# Patient Record
Sex: Female | Born: 2000 | Race: White | Hispanic: Yes | Marital: Single | Smoking: Never smoker
Health system: Southern US, Community
[De-identification: ages and names within clinical notes are randomized; demographics above are authoritative.]

## PROBLEM LIST (undated history)

## (undated) DIAGNOSIS — R519 Headache, unspecified: Secondary | ICD-10-CM

## (undated) DIAGNOSIS — H53149 Visual discomfort, unspecified: Secondary | ICD-10-CM

## (undated) DIAGNOSIS — J45909 Unspecified asthma, uncomplicated: Secondary | ICD-10-CM

## (undated) HISTORY — DX: Headache, unspecified: R51.9

---

## 2021-08-10 ENCOUNTER — Emergency Department (HOSPITAL_COMMUNITY)
Admission: EM | Admit: 2021-08-10 | Discharge: 2021-08-10 | Disposition: A | Discharge status: Home / Self Care | Attending: Emergency Medicine | Admitting: Emergency Medicine

## 2021-08-10 ENCOUNTER — Emergency Department (HOSPITAL_COMMUNITY)

## 2021-08-10 ENCOUNTER — Other Ambulatory Visit: Payer: Self-pay

## 2021-08-10 ENCOUNTER — Encounter (HOSPITAL_COMMUNITY): Payer: Self-pay

## 2021-08-10 DIAGNOSIS — R4701 Aphasia: Secondary | ICD-10-CM | POA: Diagnosis not present

## 2021-08-10 DIAGNOSIS — R2990 Unspecified symptoms and signs involving the nervous system: Secondary | ICD-10-CM | POA: Diagnosis not present

## 2021-08-10 DIAGNOSIS — R519 Headache, unspecified: Secondary | ICD-10-CM | POA: Diagnosis present

## 2021-08-10 DIAGNOSIS — G43909 Migraine, unspecified, not intractable, without status migrainosus: Secondary | ICD-10-CM | POA: Diagnosis not present

## 2021-08-10 HISTORY — DX: Unspecified asthma, uncomplicated: J45.909

## 2021-08-10 HISTORY — DX: Visual discomfort, unspecified: H53.149

## 2021-08-10 LAB — COMPREHENSIVE METABOLIC PANEL
ALT: 16 U/L (ref 0–44)
AST: 21 U/L (ref 15–41)
Albumin: 5.1 g/dL — ABNORMAL HIGH (ref 3.5–5.0)
Alkaline Phosphatase: 60 U/L (ref 38–126)
Anion gap: 10 (ref 5–15)
BUN: 13 mg/dL (ref 6–20)
CO2: 23 mmol/L (ref 22–32)
Calcium: 10.3 mg/dL (ref 8.9–10.3)
Chloride: 103 mmol/L (ref 98–111)
Creatinine, Ser: 0.58 mg/dL (ref 0.44–1.00)
GFR, Estimated: >60 mL/min (ref >60)
Glucose, Bld: 95 mg/dL (ref 70–99)
Potassium: 3.7 mmol/L (ref 3.5–5.1)
Sodium: 136 mmol/L (ref 135–145)
Total Bilirubin: 0.7 mg/dL (ref 0.3–1.2)
Total Protein: 9.2 g/dL — ABNORMAL HIGH (ref 6.5–8.1)

## 2021-08-10 LAB — CBC WITH DIFFERENTIAL/PLATELET
Abs Immature Granulocytes: 0.01 10*3/uL (ref 0.00–0.07)
Basophils Absolute: 0.1 10*3/uL (ref 0.0–0.1)
Basophils Relative: 1 %
Eosinophils Absolute: 0 10*3/uL (ref 0.0–0.5)
Eosinophils Relative: 0 %
HCT: 44.2 % (ref 36.0–46.0)
Hemoglobin: 15.2 g/dL — ABNORMAL HIGH (ref 12.0–15.0)
Immature Granulocytes: 0 %
Lymphocytes Relative: 23 %
Lymphs Abs: 1.9 10*3/uL (ref 0.7–4.0)
MCH: 29.9 pg (ref 26.0–34.0)
MCHC: 34.4 g/dL (ref 30.0–36.0)
MCV: 86.8 fL (ref 80.0–100.0)
Monocytes Absolute: 0.5 10*3/uL (ref 0.1–1.0)
Monocytes Relative: 6 %
Neutro Abs: 5.5 10*3/uL (ref 1.7–7.7)
Neutrophils Relative %: 70 %
Platelets: 302 10*3/uL (ref 150–400)
RBC: 5.09 MIL/uL (ref 3.87–5.11)
RDW: 12.6 % (ref 11.5–15.5)
WBC: 8 10*3/uL (ref 4.0–10.5)
nRBC: 0 % (ref 0.0–0.2)

## 2021-08-10 LAB — I-STAT BETA HCG BLOOD, ED (MC, WL, AP ONLY): I-stat hCG, quantitative: <5 m[IU]/mL (ref <5)

## 2021-08-10 LAB — I-STAT CHEM 8, ED
BUN: 11 mg/dL (ref 6–20)
Calcium, Ion: 1.23 mmol/L (ref 1.15–1.40)
Chloride: 103 mmol/L (ref 98–111)
Creatinine, Ser: 0.5 mg/dL (ref 0.44–1.00)
Glucose, Bld: 90 mg/dL (ref 70–99)
HCT: 45 % (ref 36.0–46.0)
Hemoglobin: 15.3 g/dL — ABNORMAL HIGH (ref 12.0–15.0)
Potassium: 3.9 mmol/L (ref 3.5–5.1)
Sodium: 139 mmol/L (ref 135–145)
TCO2: 24 mmol/L (ref 22–32)

## 2021-08-10 LAB — APTT: aPTT: 30 seconds (ref 24–36)

## 2021-08-10 LAB — PROTIME-INR
INR: 1 (ref 0.8–1.2)
Prothrombin Time: 13.6 seconds (ref 11.4–15.2)

## 2021-08-10 MED ORDER — KETOROLAC TROMETHAMINE 15 MG/ML IJ SOLN
15.0000 mg | Freq: Once | INTRAMUSCULAR | Status: AC
  Administered 2021-08-10: 15 mg via INTRAVENOUS
  Filled 2021-08-10: qty 1

## 2021-08-10 MED ORDER — METOCLOPRAMIDE HCL 5 MG/ML IJ SOLN
10.0000 mg | Freq: Once | INTRAMUSCULAR | Status: AC
  Administered 2021-08-10: 10 mg via INTRAVENOUS
  Filled 2021-08-10: qty 2

## 2021-08-10 MED ORDER — IOHEXOL 350 MG/ML SOLN
100.0000 mL | Freq: Once | INTRAVENOUS | Status: AC | PRN
  Administered 2021-08-10: 100 mL via INTRAVENOUS

## 2021-08-10 MED ORDER — DIPHENHYDRAMINE HCL 50 MG/ML IJ SOLN
25.0000 mg | Freq: Once | INTRAMUSCULAR | Status: AC
  Administered 2021-08-10: 25 mg via INTRAVENOUS
  Filled 2021-08-10: qty 1

## 2021-08-10 NOTE — Progress Notes (Signed)
Code stroke activated ?

## 2021-08-10 NOTE — ED Notes (Signed)
Pt's NIH repeated, aphasia and speech tested with stroke book. No aphasia noted, and pt was able to read all phrases, words, and describe picture. Pt still reports relief of headache symptoms, but remains sensitive to light ?

## 2021-08-10 NOTE — ED Notes (Signed)
Pt returned from CT - telepsych machine in room, experiencing technical problems, attempting to reboot with tech support now. ?

## 2021-08-10 NOTE — Progress Notes (Deleted)
[id number] Dr Rory Percy on caregility cart for neuro eval ?

## 2021-08-10 NOTE — ED Notes (Signed)
Patient transported to MRI 

## 2021-08-10 NOTE — ED Notes (Signed)
Pt reporting some relief of HA, is being wheeled to MRI at this time ?

## 2021-08-10 NOTE — Progress Notes (Signed)
Dr Wilford Corner on cart for neuro evaluation ?

## 2021-08-10 NOTE — Discharge Instructions (Signed)
Make an appointment to follow-up with the neurologist as discussed.  Return to the emergency room if you have any worsening symptoms. ?

## 2021-08-10 NOTE — ED Provider Notes (Signed)
?[facility] DEPT ?Provider Note ? ? ?CSN: [id number] ?Arrival date & time: 08/10/21  1720 ? ?  ? ?History ? ?Chief Complaint  ?Patient presents with  ? Headache  ? ? ?Mindy Woodward is a 21 y.o. female. ? ?Patient is a 21 year old female who presents with a headache and speech difficulty.  She states it started around 230 this afternoon.  She had a sudden onset of headache.  She also was having trouble getting her words out.  She was in a class at school during this time.  She denies any history of prior headaches like this.  No history of migraines.  She is holding some stuffed animals which she says are her support animals.  She denies any similar symptoms with anxiety or panic attacks. ? ? ?  ? ?Home Medications ?Prior to Admission medications   ?Not on File  ?   ? ?Allergies    ?Penicillins   ? ?Review of Systems   ?Review of Systems  ?Constitutional:  Negative for chills, diaphoresis, fatigue and fever.  ?HENT:  Negative for congestion, rhinorrhea and sneezing.   ?Eyes: Negative.   ?Respiratory:  Negative for cough, chest tightness and shortness of breath.   ?Cardiovascular:  Negative for chest pain and leg swelling.  ?Gastrointestinal:  Negative for abdominal pain, blood in stool, diarrhea, nausea and vomiting.  ?Genitourinary:  Negative for difficulty urinating, flank pain, frequency and hematuria.  ?Musculoskeletal:  Negative for arthralgias and back pain.  ?Skin:  Negative for rash.  ?Neurological:  Positive for speech difficulty and headaches. Negative for dizziness, weakness and numbness.  ? ?Physical Exam ?Updated Vital Signs ?BP 107/69   Pulse 97   Temp 98.4 ?F (36.9 ?C) (Oral)   Resp (!) 22   Ht 5\' 2"  (1.575 m)   Wt 59 kg   SpO2 98%   BMI 23.78 kg/m?  ?Physical Exam ?Constitutional:   ?   Appearance: She is well-developed.  ?HENT:  ?   Head: Normocephalic and atraumatic.  ?Eyes:  ?   Pupils: Pupils are equal, round, and reactive to light.  ?Cardiovascular:  ?   Rate  and Rhythm: Normal rate and regular rhythm.  ?   Heart sounds: Normal heart sounds.  ?Pulmonary:  ?   Effort: Pulmonary effort is normal. No respiratory distress.  ?   Breath sounds: Normal breath sounds. No wheezing or rales.  ?Chest:  ?   Chest wall: No tenderness.  ?Abdominal:  ?   General: Bowel sounds are normal.  ?   Palpations: Abdomen is soft.  ?   Tenderness: There is no abdominal tenderness. There is no guarding or rebound.  ?Musculoskeletal:     ?   General: Normal range of motion.  ?   Cervical back: Normal range of motion and neck supple.  ?Lymphadenopathy:  ?   Cervical: No cervical adenopathy.  ?Skin: ?   General: Skin is warm and dry.  ?   Findings: No rash.  ?Neurological:  ?   Mental Status: She is alert and oriented to person, place, and time.  ?   Comments: Patient has some hesitant speech and aphasia.  No facial drooping.  Motor 5 out of 5 all extremities, sensation grossly intact to light touch all extremities.  FTN intact  ? ? ?ED Results / Procedures / Treatments   ?Labs ?(all labs ordered are listed, but only abnormal results are displayed) ?Labs Reviewed  ?CBC WITH DIFFERENTIAL/PLATELET - Abnormal; Notable for the following  components:  ?    Result Value  ? Hemoglobin 15.2 (*)   ? All other components within normal limits  ?COMPREHENSIVE METABOLIC PANEL - Abnormal; Notable for the following components:  ? Total Protein 9.2 (*)   ? Albumin 5.1 (*)   ? All other components within normal limits  ?I-STAT CHEM 8, ED - Abnormal; Notable for the following components:  ? Hemoglobin 15.3 (*)   ? All other components within normal limits  ?PROTIME-INR  ?APTT  ?URINALYSIS, ROUTINE W REFLEX MICROSCOPIC  ?ETHANOL  ?RAPID URINE DRUG SCREEN, HOSP PERFORMED  ?I-STAT BETA HCG BLOOD, ED ([facility], [facility], [facility] ONLY)  ? ? ?EKG ?None ? ?Radiology ?MR BRAIN WO CONTRAST ? ?Result Date: 08/10/2021 ?CLINICAL DATA:  Acute neurologic deficit. Slurred speech and headache. EXAM: MRI HEAD WITHOUT CONTRAST TECHNIQUE: Multiplanar,  multiecho pulse sequences of the brain and surrounding structures were obtained without intravenous contrast. COMPARISON:  None. FINDINGS: Brain: No acute infarct, mass effect or extra-axial collection. No acute or chronic hemorrhage. Normal white matter signal, parenchymal volume and CSF spaces. The midline structures are normal. Vascular: Major flow voids are preserved. Skull and upper cervical spine: Normal calvarium and skull base. Visualized upper cervical spine and soft tissues are normal. Sinuses/Orbits:No paranasal sinus fluid levels or advanced mucosal thickening. No mastoid or middle ear effusion. Normal orbits. IMPRESSION: Normal brain MRI. Electronically Signed   By: Ulyses Jarred M.D.   On: 08/10/2021 21:20  ? ?CT HEAD CODE STROKE WO CONTRAST ? ?Result Date: 08/10/2021 ?CLINICAL DATA:  Headache and dizziness since 2 p.m. today. EXAM: CT ANGIOGRAPHY HEAD AND NECK CT Perfusion Head TECHNIQUE: Multidetector CT imaging of the head and neck was performed using the standard protocol during bolus administration of intravenous contrast. Multiplanar CT image reconstructions and MIPs were obtained to evaluate the vascular anatomy. Carotid stenosis measurements (when applicable) are obtained utilizing NASCET criteria, using the distal internal carotid diameter as the denominator. Multiphase CT imaging of the brain was performed following IV bolus contrast injection. Subsequent parametric perfusion maps were calculated using RAPID software. RADIATION DOSE REDUCTION: This exam was performed according to the departmental dose-optimization program which includes automated exposure control, adjustment of the mA and/or kV according to patient size and/or use of iterative reconstruction technique. CONTRAST:  100 cc Omnipaque 350 COMPARISON:  None. FINDINGS: Brain: There is no evidence of acute intracranial hemorrhage, extra-axial fluid collection, or acute infarct. Parenchymal volume is normal. Gray-white differentiation  is preserved. There is no mass lesion. There is no mass effect or midline shift. Vascular: No hyperdense vessel or unexpected calcification. Skull: Normal. Negative for fracture or focal lesion. Sinuses/Orbits: The imaged paranasal sinuses are clear. The globes and orbits are unremarkable. Other: None. ASPECTS [facility] Stroke Program Early CT Score) - Ganglionic level infarction (caudate, lentiform nuclei, internal capsule, insula, M1-M3 cortex): 7 - Supraganglionic infarction (M4-M6 cortex): 3 Total score (0-10 with 10 being normal): 10 CTA NECK FINDINGS Aortic arch: The aortic arch is normal. The origins of the major branch vessels are patent. The subclavian arteries are patent to the level imaged. Right carotid system: The right common, internal, and external carotid arteries are patent, without hemodynamically significant stenosis or occlusion. There is no dissection or aneurysm. Left carotid system: The left common, internal, and external carotid arteries are patent, without hemodynamically significant stenosis or occlusion. There is no dissection or aneurysm. Vertebral arteries: The vertebral arteries are patent, without hemodynamically significant stenosis or occlusion. There is no dissection or aneurysm. Skeleton: The bones are  normal. There is no acute osseous abnormality or aggressive osseous lesion. There is no visible canal hematoma. Other neck: The soft tissues are unremarkable. Upper chest: Imaged lung apices are clear. Other: There is a 5 mm x 8 mm soft tissue nodule in the subcutaneous fat of the left upper back at the C7-T1 level (7-114). Review of the MIP images confirms the above findings CTA HEAD FINDINGS Anterior circulation: The intracranial ICAs are patent. The bilateral MCAs are patent. The bilateral ACAs are patent. The anterior communicating artery is normal. There is no aneurysm or AVM. Posterior circulation: The bilateral V4 segments are patent. PICA is identified bilaterally. The basilar  artery is patent. The bilateral PCAs are patent. Small bilateral posterior communicating arteries are identified. There is no aneurysm or AVM. Venous sinuses: Patent. Anatomic variants: None. Review of the MI

## 2021-08-10 NOTE — Consult Note (Signed)
[facility] Telemedicine Consult ? ? ?Requesting Provider:  Dr Fredderick Phenix ?Consult Participants: Dr. Marthe Patch, Telespecialist RN Deanna   Bedside RN--Anna ?Location of the provider: ?Location of the patient: [facility] 10 ? ?This consult was provided via telemedicine with 2-way video and audio communication. The patient/family was informed that care would be provided in this way and agreed to receive care in this manner.  ? ? ?Chief Complaint: Speech problem, headache ? ?HPI: Patient is a 21 year old woman with no significant past medical history other than asthma and a chronic history of photophobia, who was brought in for evaluation of multiple symptoms with last known well of around 2 PM. ?She told me that she was in her Spanish class watching a Spanish movie and started getting a headache and feeling unwell.  She was having some trouble with her speech.  I could not get a full sense of how she was brought in but she got to the hospital and that it was noticed that she had some expressive aphasia.  She has also been complaining of some cough which is new as well as some new headache.  She denies history of migraines but says she has had photophobia without headaches for many years.  Currently she has a headache that had started around 2 PM and has not gone away and actually has gotten worse. ?Her speech is somewhat stuttered.  She also said that she was having trouble grasping her stuffed animals which are her support animals. ?She was taken for stat head CT which was normal.  CT angio head and neck and CT perfusion study was done given that she was outside the window for IV tPA.  All of them were unremarkable-see detailed reads in the chart. ? ?After the imaging was completed and the patient was back in the room, I specifically asked her about stressors-she reports some strained relationship with parents and some ongoing trouble and was somewhat evasive and not willing to discuss those issues as her roommate  was also in the hospital room. ? ? ?Past Medical History:  ?Diagnosis Date  ? Asthma   ? Photophobia   ? ? ?No current facility-administered medications for this encounter. ?No current outpatient medications on file. ? ? ? ?LKW: 2 PM ?tpa given?: No, stroke less likely on exam and patient outside the window by the time stroke paged out ?IR Thrombectomy? No, not an LVO ?Modified Rankin Scale: 0-Completely asymptomatic and back to baseline post- stroke ?Time of teleneurologist evaluation: 6:47 PM ? ?Exam: ?Vitals:  ? 08/10/21 1804  ?BP: 115/75  ?Pulse: 94  ?Resp: 16  ?Temp: 98.4 ?F (36.9 ?C)  ?SpO2: 97%  ? ? ?General: Awake alert in no distress ?HEENT: Normocephalic/atraumatic ?CVS: Regular rate rhythm on the monitor ?Respiratory: Breathing well saturating normally on room air ?Neurological exam ?She is awake alert oriented x3.  Her speech has somewhat of a hesitancy and stuttering nature.  No problems with naming, repetition or comprehension.  At times she did start to talk in a baby voice.  Face symmetric, pupils equal round reactive, extraocular movements intact, visual fields full, facial sensation intact.  No drift in any fours.  Sensation intact.  Coordination intact. ? ? ?NIHSS ?1A: Level of Consciousness - 0 ?1B: Ask Month and Age - 0 ?1C: 'Blink Eyes' & 'Squeeze Hands' - 0 ?2: Test Horizontal Extraocular Movements - 0 ?3: Test Visual Fields - 0 ?4: Test Facial Palsy - 0 ?5A: Test Left Arm Motor Drift - 0 ?5B:  Test Right Arm Motor Drift - 0 ?6A: Test Left Leg Motor Drift - 0 ?6B: Test Right Leg Motor Drift - 0 ?7: Test Limb Ataxia - 0 ?8: Test Sensation - 0 ?9: Test Language/Aphasia- 0 ?10: Test Dysarthria - 1 ?11: Test Extinction/Inattention - 0 ?NIHSS score: 1 ? ? ?Imaging Reviewed:  ?CT head aspects 10.  No bleed ?CT angiography head and neck-no emergent LVO ?CT perfusion study was marred by motion-she had a big coughing fit while the CT perfusion study was being done and there are some artifactual numbers  there-unreliable study. ? ?Labs reviewed in epic and pertinent values follow: ?CBC ?   ?Component Value Date/Time  ? WBC 8.0 08/10/2021 1829  ? RBC 5.09 08/10/2021 1829  ? HGB 15.3 (H) 08/10/2021 1846  ? HCT 45.0 08/10/2021 1846  ? PLT 302 08/10/2021 1829  ? MCV 86.8 08/10/2021 1829  ? MCH 29.9 08/10/2021 1829  ? MCHC 34.4 08/10/2021 1829  ? RDW 12.6 08/10/2021 1829  ? LYMPHSABS 1.9 08/10/2021 1829  ? MONOABS 0.5 08/10/2021 1829  ? EOSABS 0.0 08/10/2021 1829  ? BASOSABS 0.1 08/10/2021 1829  ? ?CMP  ?   ?Component Value Date/Time  ? NA 139 08/10/2021 1846  ? K 3.9 08/10/2021 1846  ? CL 103 08/10/2021 1846  ? CO2 23 08/10/2021 1829  ? GLUCOSE 90 08/10/2021 1846  ? BUN 11 08/10/2021 1846  ? CREATININE 0.50 08/10/2021 1846  ? CALCIUM 10.3 08/10/2021 1829  ? PROT 9.2 (H) 08/10/2021 1829  ? ALBUMIN 5.1 (H) 08/10/2021 1829  ? AST 21 08/10/2021 1829  ? ALT 16 08/10/2021 1829  ? ALKPHOS 60 08/10/2021 1829  ? BILITOT 0.7 08/10/2021 1829  ? GFRNONAA >60 08/10/2021 1829  ? ? ? ?Assessment:  ?21 year old with past medical history of asthma and photophobia presenting for evaluation of sudden onset of headache followed by speech disturbance-initially noted to have some expressive aphasia but on my examination, on exam was more consistent with just some stuttering speech along with some nonorganic findings as documented in the exam. ?I suspect at this time her symptoms might be related to a complex migraine or conversion disorder given some familial discord with parents. ?That said, a missed stroke in a young individual would be catastrophic and I would recommend stat MRI imaging. ?CT head, CT angio head and neck and CT perfusion study are unremarkable for any evidence of a large acute ischemic stroke. ? ?Impression: ?- Evaluate for stroke-less likely ? 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 ?Recommendations:  ?MRI brain w/o contrast - STAT ?Migraine cocktail ?If MRI is negative and the headache improves with a migraine cocktail,  can be discharged home with outpatient neurology follow-up ?If her MRI is negative but the symptoms do not improve, I would further investigate with an EEG.  Unfortunately, nights and weekends EEG is not available at [facility] so if she needs to be admitted for observation, it should be done at [facility] under the hospital service. ?May need outpatient psychologist/psychiatrist referral as well on discharge. ? ? ? ?Discussed with EDP--Dr. Fredderick Phenix, patient. ? ?This patient is receiving care for possible acute neurological changes. There was 55 minutes of care by this provider at the time of service, including time for direct evaluation via telemedicine, review of medical records, imaging studies and discussion of findings with providers, the patient and/or family. ? ?-- ?Milon Dikes, MD ?[facility] ?Pager: [phone] ?If 7pm to 7am, please call on call as listed on AMION. ?

## 2021-08-10 NOTE — ED Notes (Signed)
Patient transported to CT 

## 2021-08-10 NOTE — ED Notes (Signed)
Patient verbalizes understanding of discharge instructions. Opportunity for questioning and answers were provided. Armband removed by staff, pt discharged from ED. Ambulated out to lobby, riding home with friend ? ?

## 2021-08-10 NOTE — ED Triage Notes (Addendum)
Patient c/o headache and dizziness since 1400 today. Patient denies any N/V or blurred vision. ? ? ?Patient having speech difficulties and clutching stuffed animals. Patient states the pigs are her support. ?

## 2021-08-27 NOTE — Progress Notes (Signed)
? ?Referring:  ?Malvin Johns, MD ?[address] ?[place],  [place] [id number] ? ?PCP: ?Cyril Loosen B, PA-C ? ?Neurology was asked to evaluate Mindy Woodward, a 21 year old nonbinary patient for a chief complaint of headaches.  Our recommendations of care will be communicated by shared medical record.   ? ?CC:  headaches ? ?History provided from self ? ?HPI:  ?Medical co-morbidities: asthma ? ?The patient presents for evaluation of an episode of headache and slurred speech which occurred August 10, 2021. They were in Spanish class when they developed a bad headache. No clear triggers, although they note that they had skipped lunch that day. Their teacher noticed that their speech was slurred and cognition was slowed, so they were sent to the ED. At the ED, they struggled to answer basic questions including their race. MRI brain and CTA head/neck were unremarkable. Symptoms improved after they were given a headache cocktail. Headache lasted for a total of 4-5 hours. It was described as holocephalic aching with associated nausea. The patient has photophobia and phonophobia at baseline. ? ?They have a history of mild headaches which improve with caffeine, otherwise had not had severe headaches. ? ?Headache History: ?Onset: August 10, 2021 ?Triggers: none ?Aura: no ?Location: holocephalic ?Quality/Description: aching ?Associated Symptoms: ? Photophobia: sensitive to light at baseline ? Phonophobia: sensitive to noise at baseline ? Nausea: yes ?Worse with activity?: yes ?Duration of headaches: 4-5 hours ? ? ?Headache days per month: 1 ?Headache free days per month: 29 ? ?Current Treatment: ?Abortive ?none ? ?Preventative ?none ? ?Prior Therapies                                 ?none ? ? ?LABS: ?CBC ?   ?Component Value Date/Time  ? WBC 8.0 08/10/2021 1829  ? RBC 5.09 08/10/2021 1829  ? HGB 15.3 (H) 08/10/2021 1846  ? HCT 45.0 08/10/2021 1846  ? PLT 302 08/10/2021 1829  ? MCV 86.8 08/10/2021 1829  ? MCH 29.9 08/10/2021 1829   ? MCHC 34.4 08/10/2021 1829  ? RDW 12.6 08/10/2021 1829  ? LYMPHSABS 1.9 08/10/2021 1829  ? MONOABS 0.5 08/10/2021 1829  ? EOSABS 0.0 08/10/2021 1829  ? BASOSABS 0.1 08/10/2021 1829  ? ? ?  Latest Ref Rng & Units 08/10/2021  ?  6:46 PM 08/10/2021  ?  6:29 PM  ?CMP  ?Glucose 70 - 99 mg/dL 90   95    ?BUN 6 - 20 mg/dL 11   13    ?Creatinine 0.44 - 1.00 mg/dL 0.50   0.58    ?Sodium 135 - 145 mmol/L 139   136    ?Potassium 3.5 - 5.1 mmol/L 3.9   3.7    ?Chloride 98 - 111 mmol/L 103   103    ?CO2 22 - 32 mmol/L  23    ?Calcium 8.9 - 10.3 mg/dL  10.3    ?Total Protein 6.5 - 8.1 g/dL  9.2    ?Total Bilirubin 0.3 - 1.2 mg/dL  0.7    ?Alkaline Phos 38 - 126 U/L  60    ?AST 15 - 41 U/L  21    ?ALT 0 - 44 U/L  16    ? ? ? ?IMAGING:  ?MRI brain 08/10/21: unremarkable ?CTA head/neck 08/10/21: no large vessel occlusion or flow limiting stenosis ? ?Imaging independently reviewed on August 28, 2021  ? ?Current Outpatient Medications on File Prior to Visit  ?  Medication Sig Dispense Refill  ? albuterol (VENTOLIN HFA) 108 (90 Base) MCG/ACT inhaler Inhale 2 puffs into the lungs every 4 (four) hours as needed.    ? ?No current facility-administered medications on file prior to visit.  ? ? ? ?Allergies: ?Allergies  ?Allergen Reactions  ? Penicillins   ? ? ?Family History: ?Migraine or other headaches in the family:  none ?Aneurysms in a first degree relative:  none ?Brain tumors in the family:  none ?Other neurological illness in the family:   none ? ?Past Medical History: ?Past Medical History:  ?Diagnosis Date  ? Asthma   ? Photophobia   ? ? ?Past Surgical History ?History reviewed. No pertinent surgical history. ? ?Social History: ?Social History  ? ?Tobacco Use  ? Smoking status: Never  ? Smokeless tobacco: Never  ?Vaping Use  ? Vaping Use: Never used  ?Substance Use Topics  ? Alcohol use: Never  ? Drug use: Never  ? ? ?ROS: ?Negative for fevers, chills. Positive for headaches. All other systems reviewed and negative unless stated  otherwise in HPI. ? ? ?Physical Exam:  ? ?Vital Signs: ?BP 107/71   Pulse 87   Ht 5\' 2"  (1.575 m)   Wt 138 lb (62.6 kg)   BMI 25.24 kg/m?  ?GENERAL: well appearing,in no acute distress,alert ?SKIN:  Color, texture, turgor normal. No rashes or lesions ?HEAD:  Normocephalic/atraumatic. ?CV:  RRR ?RESP: Normal respiratory effort ?MSK: +tenderness to palpation over bilateral temples ? ?NEUROLOGICAL: ?Mental Status: Alert, oriented to person, place and time,Follows commands ?Cranial Nerves: PERRL, visual fields intact to confrontation, extraocular movements intact, facial sensation intact, no facial droop or ptosis, hearing grossly intact, no dysarthria ?Motor: muscle strength 5/5 both upper and lower extremities ?Reflexes: 2+ throughout ?Sensation: intact to light touch all 4 extremities ?Coordination: Finger-to- nose-finger intact bilaterally ?Gait: normal-based ? ? ?IMPRESSION: ?21 year old nonbinary patient with a history of asthma who presents for evaluation of headaches and slurred speech. MRI brain and CTA head and neck were normal. Their episode in the ED likely represents a migraine headache. They have not had any other headaches since their ED visit. Will start Maxalt for migraine rescue. Counseled on maintaining a regular eating schedule to help avoid future headaches. ? ?PLAN: ?-Rescue: Start Maxalt 10 mg PRN ?-Counseled to maintain regular eating schedule (skipped lunch when last migraine occurred) ? ?I spent a total of 30 minutes chart reviewing and counseling the patient. Headache education was done. Discussed treatment options including acute medications. Discussed medication side effects, adverse reactions and drug interactions. Written educational materials and patient instructions outlining all of the above were given. ? ?Follow-up: 6 months ? ? ?Genia Harold, MD ?08/28/2021   ?1:27 PM ? ? ?

## 2021-08-28 ENCOUNTER — Encounter: Payer: Self-pay | Admitting: Psychiatry

## 2021-08-28 ENCOUNTER — Ambulatory Visit: Admitting: Psychiatry

## 2021-08-28 VITALS — BP 107/71 | HR 87 | Ht 62.0 in | Wt 138.0 lb

## 2021-08-28 DIAGNOSIS — G43009 Migraine without aura, not intractable, without status migrainosus: Secondary | ICD-10-CM

## 2021-08-28 MED ORDER — RIZATRIPTAN BENZOATE 10 MG PO TABS: 10.0000 mg | ORAL_TABLET | ORAL | 3 refills | Status: AC | PRN

## 2021-08-28 NOTE — Patient Instructions (Addendum)
Start rizatriptan as needed for migraine. Take at the onset of migraine. If headache recurs or does not fully resolve, you may take a second dose after 2 hours. Please avoid taking more than 2 days per week  to avoid rebound headaches ? ? ? ?HEADACHE DIET: Foods and beverages which may trigger migraine ?Note that only 20% of headache patients are food sensitive. You will know if you are food sensitive if you get a headache consistently 20 minutes to 2 hours after eating a certain food. Only cut out a food if it causes headaches, otherwise you might remove foods you enjoy! What matters most for diet is to eat a well balanced healthy diet full of vegetables and low fat protein, and to not miss meals. ? ?Chocolate, other sweets ?ALL cheeses except cottage and cream cheese ?Dairy products, yogurt, sour cream, ice cream ?Liver ?Meat extracts (Bovril, Marmite, meat tenderizers) ?Meats or fish which have undergone aging, fermenting, pickling or smoking. These include: Hotdogs,salami,Lox,sausage, ?mortadellas,smoked salmon, pepperoni, Pickled herring ?Pods of broad bean (English beans, Chinese pea pods, [place] (fava) beans, lima and navy beans ?Ripe avocado, ripe banana ?Yeast extracts or active yeast preparations such as Brewer's or Fleishman's (commercial bakes goods are permitted) ?Tomato based foods, pizza (lasagna, etc.) ? ?MSG (monosodium glutamate) is disguised as many things; look for these common aliases: ?Monopotassium glutamate ?Autolysed yeast ?Hydrolysed protein ?Sodium caseinate ??flavorings? ??all natural preservatives" ?Nutrasweet ? ?Avoid all other foods that convincingly provoke headaches. ? ?Resources: The Dizzy [place] Laundry Your Headache Diet, migrainestrong.com  ?https://zamora-andrews.com/ ? ?Caffeine and Migraine ?For patients that have migraine, caffeine intake more than 3 days per week can lead to dependency and increased migraine frequency. I would  recommend cutting back on your caffeine intake as best you can. The recommended amount of caffeine is 200-300 mg daily, although migraine patients may experience dependency at even lower doses. While you may notice an increase in headache temporarily, cutting back will be helpful for headaches in the long run. For more information on caffeine and migraine, visit: https://americanmigrainefoundation.org/resource-library/caffeine-and-migraine/ ? ?Headache Prevention Strategies: ? ?1. Maintain a headache diary; learn to identify and avoid triggers.  ?- This can be a simple note where you log when you had a headache, associated symptoms, and medications used ?- There are several smartphone apps developed to help track migraines: Migraine Buddy, Migraine Monitor, Curelator N1-Headache App ? ?Common triggers include: ?Emotional triggers: ?Emotional/Upset family or friends ?Emotional/Upset occupation ?Business reversal/success ?Anticipation anxiety ?Crisis-serious ?Post-crisis periodNew job/position ?  ?Physical triggers: ?Vacation Day ?Weekend ?Strenuous Exercise ?High Altitude Location ?New Move ?Menstrual Day ?Physical Illness ?Oversleep/Not enough sleep ?Weather changes ?Light: Photophobia or light sesnitivity treatment involves a balance between desensitization and reduction in overly strong input. Use dark polarized glasses outside, but not inside. Avoid bright or fluorescent light, but do not dim environment to the point that going into a normally lit room hurts. Consider FL-41 tint lenses, which reduce the most irritating wavelengths without blocking too much light.  These can be obtained at axonoptics.com or theraspecs.com ?Foods: see list above. ? ?2. Limit use of acute treatments (over-the-counter medications, triptans, etc.) to no more than 2 days per week or 10 days per month to prevent medication overuse headache (rebound headache).   ? ?3. Follow a regular schedule (including weekends and holidays): ?Don't  skip meals. Eat a balanced diet. ?8 hours of sleep nightly. ?Minimize stress. ?Exercise 30 minutes per day. Being overweight is associated with a 5 times increased risk of chronic migraine. ?Keep  well hydrated and drink 6-8 glasses of water per day. ? ?4. Initiate non-pharmacologic measures at the earliest onset of your headache. ?Rest and quiet environment. ?Relax and reduce stress. Breathe2Relax is a free app that can instruct you on    some simple relaxtion and breathing techniques. Http://Dawnbuse.com is a    free website that provides teaching videos on relaxation.  Also, there are  many apps that   can be downloaded for ?mindful? relaxation.  An app called YOGA NIDRA will help walk you through mindfulness. Another app called Calm can be downloaded to give you a structured mindfulness guide with daily reminders and skill development. Headspace for guided meditation ?Mindfulness Based Stress Reduction Online Course: www.palousemindfulness.com ?Cold compresses. ? ?5. Don't wait!! Take the maximum allowable dosage of prescribed medication at the first sign of migraine. ? ?6. Compliance:  Take prescribed medication regularly as directed and at the first sign of a migraine. ? ?7. Communicate:  Call your physician when problems arise, especially if your headaches change, increase in frequency/severity, or become associated with neurological symptoms (weakness, numbness, slurred speech, etc.). ? ?8. Headache/pain management therapies: Consider various complementary methods, including medication, behavioral therapy, psychological counselling, biofeedback, massage therapy, acupuncture, dry needling, and other modalities.  Such measures may reduce the need for medications. Counseling for pain management, where patients learn to function and ignore/minimize their pain, seems to work very well. ? ? ?

## 2021-09-17 ENCOUNTER — Emergency Department (HOSPITAL_COMMUNITY)

## 2021-09-17 ENCOUNTER — Encounter (HOSPITAL_COMMUNITY): Payer: Self-pay | Admitting: Emergency Medicine

## 2021-09-17 ENCOUNTER — Emergency Department (HOSPITAL_COMMUNITY)
Admission: EM | Admit: 2021-09-17 | Discharge: 2021-09-17 | Disposition: A | Discharge status: Home / Self Care | Attending: Emergency Medicine | Admitting: Emergency Medicine

## 2021-09-17 DIAGNOSIS — R000 Tachycardia, unspecified: Secondary | ICD-10-CM | POA: Insufficient documentation

## 2021-09-17 DIAGNOSIS — N9489 Other specified conditions associated with female genital organs and menstrual cycle: Secondary | ICD-10-CM | POA: Insufficient documentation

## 2021-09-17 DIAGNOSIS — R002 Palpitations: Secondary | ICD-10-CM | POA: Diagnosis not present

## 2021-09-17 LAB — CBC
HCT: 44.6 % (ref 36.0–46.0)
Hemoglobin: 15.4 g/dL — ABNORMAL HIGH (ref 12.0–15.0)
MCH: 30.1 pg (ref 26.0–34.0)
MCHC: 34.5 g/dL (ref 30.0–36.0)
MCV: 87.1 fL (ref 80.0–100.0)
Platelets: 339 10*3/uL (ref 150–400)
RBC: 5.12 MIL/uL — ABNORMAL HIGH (ref 3.87–5.11)
RDW: 12.3 % (ref 11.5–15.5)
WBC: 8 10*3/uL (ref 4.0–10.5)
nRBC: 0 % (ref 0.0–0.2)

## 2021-09-17 LAB — URINALYSIS, ROUTINE W REFLEX MICROSCOPIC
Bilirubin Urine: NEGATIVE
Glucose, UA: NEGATIVE mg/dL
Hgb urine dipstick: NEGATIVE
Ketones, ur: NEGATIVE mg/dL
Leukocytes,Ua: NEGATIVE
Nitrite: NEGATIVE
Protein, ur: NEGATIVE mg/dL
Specific Gravity, Urine: 1.01 (ref 1.005–1.030)
pH: 7 (ref 5.0–8.0)

## 2021-09-17 LAB — BASIC METABOLIC PANEL
Anion gap: 9 (ref 5–15)
BUN: 14 mg/dL (ref 6–20)
CO2: 26 mmol/L (ref 22–32)
Calcium: 10.2 mg/dL (ref 8.9–10.3)
Chloride: 105 mmol/L (ref 98–111)
Creatinine, Ser: 0.6 mg/dL (ref 0.44–1.00)
GFR, Estimated: >60 mL/min (ref >60)
Glucose, Bld: 104 mg/dL — ABNORMAL HIGH (ref 70–99)
Potassium: 3.9 mmol/L (ref 3.5–5.1)
Sodium: 140 mmol/L (ref 135–145)

## 2021-09-17 LAB — I-STAT BETA HCG BLOOD, ED (MC, WL, AP ONLY): I-stat hCG, quantitative: <5 m[IU]/mL (ref <5)

## 2021-09-17 LAB — D-DIMER, QUANTITATIVE: D-Dimer, Quant: <0.27 ug/mL-FEU (ref 0.00–0.50)

## 2021-09-17 LAB — TSH: TSH: 2.053 u[IU]/mL (ref 0.350–4.500)

## 2021-09-17 NOTE — ED Triage Notes (Signed)
Per EMS, patient from [facility], c/o tachycardia, worsening with standing and ambulating. States feels "tight band around chest" that worsens with deep breathing.  ?

## 2021-09-17 NOTE — Discharge Instructions (Signed)
Overall recommend that you focus on sleep, stress reduction, decrease caffeine intake.  Your work-up today overall shows that your lungs and heart are in great health. ?

## 2021-09-17 NOTE — ED Provider Notes (Signed)
?[facility]-EMERGENCY DEPT ?Provider Note ? ? ?CSN: [id number] ?Arrival date & time: 09/17/21  1640 ? ?  ? ?History ? ?Chief Complaint  ?Patient presents with  ? Palpitations  ? ? ?Mindy Woodward is a 21 y.o. adult. ? ?The history is provided by the patient.  ?Palpitations ?Palpitations quality:  Fast ?Onset quality:  Sudden ?Timing:  Intermittent ?Progression:  Waxing and waning ?Chronicity:  New ?Context: anxiety and caffeine   ?Relieved by:  Nothing ?Worsened by:  Nothing ?Associated symptoms: no back pain, no chest pain, no chest pressure, no cough, no diaphoresis, no dizziness, no hemoptysis, no leg pain, no lower extremity edema, no malaise/fatigue, no nausea, no near-syncope, no numbness, no orthopnea, no PND, no shortness of breath, no syncope, no vomiting and no weakness   ?Risk factors: no hx of PE   ? ?  ? ?Home Medications ?Prior to Admission medications   ?Medication Sig Start Date End Date Taking? Authorizing Provider  ?albuterol (VENTOLIN HFA) 108 (90 Base) MCG/ACT inhaler Inhale 2 puffs into the lungs every 4 (four) hours as needed. 07/12/21   [provider]  ?rizatriptan (MAXALT) 10 MG tablet Take 1 tablet (10 mg total) by mouth as needed for migraine. May repeat in 2 hours if needed 08/28/21   Ocie Doyne, MD  ?   ? ?Allergies    ?Penicillins   ? ?Review of Systems   ?Review of Systems  ?Constitutional:  Negative for diaphoresis and malaise/fatigue.  ?Respiratory:  Negative for cough, hemoptysis and shortness of breath.   ?Cardiovascular:  Positive for palpitations. Negative for chest pain, orthopnea, syncope, PND and near-syncope.  ?Gastrointestinal:  Negative for nausea and vomiting.  ?Musculoskeletal:  Negative for back pain.  ?Neurological:  Negative for dizziness, weakness and numbness.  ? ?Physical Exam ?Updated Vital Signs ?BP 109/79   Pulse 100   Temp 98.8 ?F (37.1 ?C) (Oral)   Resp 16   SpO2 97%  ?Physical Exam ?Vitals and nursing note reviewed.   ?Constitutional:   ?   General: Mindy Woodward is not in acute distress. ?   Appearance: Mindy Woodward is well-developed. Mindy Woodward is not ill-appearing.  ?HENT:  ?   Head: Normocephalic and atraumatic.  ?   Nose: Nose normal.  ?   Mouth/Throat:  ?   Mouth: Mucous membranes are moist.  ?Eyes:  ?   Extraocular Movements: Extraocular movements intact.  ?   Conjunctiva/sclera: Conjunctivae normal.  ?   Pupils: Pupils are equal, round, and reactive to light.  ?Cardiovascular:  ?   Rate and Rhythm: Normal rate and regular rhythm.  ?   Heart sounds: No murmur heard. ?Pulmonary:  ?   Effort: Pulmonary effort is normal. No respiratory distress.  ?   Breath sounds: Normal breath sounds.  ?Abdominal:  ?   Palpations: Abdomen is soft.  ?   Tenderness: There is no abdominal tenderness.  ?Musculoskeletal:     ?   General: No swelling.  ?   Cervical back: Neck supple.  ?Skin: ?   General: Skin is warm and dry.  ?   Capillary Refill: Capillary refill takes less than 2 seconds.  ?Neurological:  ?   General: No focal deficit present.  ?   Mental Status: Mindy Woodward is alert and oriented to person, place, and time.  ?   Cranial Nerves: No cranial nerve deficit.  ?   Motor: No weakness.  ?   Coordination: Coordination normal.  ?Psychiatric:     ?  Mood and Affect: Mood normal.  ?   Comments: Patient mildly anxious but denies any suicidal homicidal ideation  ? ? ?ED Results / Procedures / Treatments   ?Labs ?(all labs ordered are listed, but only abnormal results are displayed) ?Labs Reviewed  ?BASIC METABOLIC PANEL - Abnormal; Notable for the following components:  ?    Result Value  ? Glucose, Bld 104 (*)   ? All other components within normal limits  ?CBC - Abnormal; Notable for the following components:  ? RBC 5.12 (*)   ? Hemoglobin 15.4 (*)   ? All other components within normal limits  ?URINALYSIS, ROUTINE W REFLEX MICROSCOPIC - Abnormal; Notable for the following components:  ? APPearance HAZY (*)   ? All other  components within normal limits  ?D-DIMER, QUANTITATIVE  ?TSH  ?I-STAT BETA HCG BLOOD, ED ([facility], [facility], [facility] ONLY)  ? ? ?EKG ?EKG Interpretation ? ?Date/Time:  Monday Sep 17 2021 17:02:25 EDT ?Ventricular Rate:  111 ?PR Interval:  120 ?QRS Duration: 77 ?QT Interval:  316 ?QTC Calculation: 430 ?R Axis:   85 ?Text Interpretation: Sinus tachycardia Nonspecific T abnormalities, diffuse leads Confirmed by Virgina Norfolk [phone]) on 09/17/2021 7:36:51 PM ? ?Radiology ?DG Chest 2 View ? ?Result Date: 09/17/2021 ?CLINICAL DATA:  Per EMS, patient from [facility], c/o tachycardia, worsening with standing and ambulating. States feels "tight band around chest" that worsens with deep breathing - never a smoker - pt states no other chest hx EXAM: CHEST - 2 VIEW COMPARISON:  none FINDINGS: Lungs are clear. Heart size and mediastinal contours are within normal limits. No effusion. Visualized bones unremarkable. IMPRESSION: No acute cardiopulmonary disease. Electronically Signed   By: Corlis Leak M.D.   On: 09/17/2021 17:15   ? ?Procedures ?Procedures  ? ? ?Medications Ordered in ED ?Medications - No data to display ? ?ED Course/ Medical Decision Making/ A&P ?  ?                        ?Medical Decision Making ?Amount and/or Complexity of Data Reviewed ?Radiology: ordered. ? ? ?Mindy Woodward is here with palpitations.  No major medical history.  Upon my evaluation normal vitals.  EKG shows sinus tachycardia.  No ischemic changes.  Lab work done prior to my evaluation.  She admits to heavy caffeine use recently.  She currently is a Archivist during finals week.  Lots of anxiety.  But denies any suicidal homicidal ideation.  She has some decreased sleep last night as well.  Blood work per my review and interpretation shows no significant anemia, electrolyte abnormality, kidney injury, leukocytosis.  D-dimer is normal and doubt PE.  No concern for ACS.  Chest x-ray per my review and interpretation shows no evidence of pneumonia or pneumothorax.   Overall patient appears improved and calm.  Recommend cutting back on caffeine and focusing on sleep.  Discharged in good condition.  Understands return precautions. ? ?This chart was dictated using voice recognition software.  Despite best efforts to proofread,  errors can occur which can change the documentation meaning.  ? ? ? ? ? ? ? ?Final Clinical Impression(s) / ED Diagnoses ?Final diagnoses:  ?Palpitations  ? ? ?Rx / DC Orders ?ED Discharge Orders   ? ? None  ? ?  ? ? ?  ?Virgina Norfolk, DO ?09/17/21 1946 ? ?

## 2021-09-17 NOTE — ED Provider Triage Note (Signed)
Emergency Medicine Provider Triage Evaluation Note ? ?Mindy Woodward , a 21 y.o. adult  was evaluated in triage.  Pt complains of heart palpitation. Report heart palpitation while sitting at desk today.  Report sob intermittently for the past 4 months, increase with exertion.  Tightness in chest.  ? ?Review of Systems  ?Positive: Heart palpitation, cp, sob, lighthead, nausea ?Negative: Fever, cough, hemoptysis, leg swelling ? ?Physical Exam  ?BP 109/79   Pulse 100   Temp 98.8 ?F (37.1 ?C) (Oral)   Resp 16   SpO2 97%  ?Gen:   Awake, no distress   ?Resp:  Normal effort  ?MSK:   Moves extremities without difficulty  ?Other:  tachycardia ? ?Medical Decision Making  ?Medically screening exam initiated at 5:05 PM.  Appropriate orders placed.  Valine I. Marich was informed that the remainder of the evaluation will be completed by another provider, this initial triage assessment does not replace that evaluation, and the importance of remaining in the ED until their evaluation is complete. ? ? ?  ?Fayrene Helper, PA-C ?09/17/21 1740 ? ?

## 2022-01-25 NOTE — Progress Notes (Unsigned)
Synopsis: Referred for dyspnea by Light, Alyssa B, PA-C  Subjective:   PATIENT ID: Mindy Woodward GENDER: nonbinary DOB: 19-Oct-2000, MRN: [id number]  No chief complaint on file.  21y with history of asthma  Otherwise pertinent review of systems is negative.  Past Medical History:  Diagnosis Date   Asthma    Photophobia      No family history on file.   No past surgical history on file.  Social History   Socioeconomic History   Marital status: Single    Spouse name: Not on file   Number of children: Not on file   Years of education: Not on file   Highest education level: Not on file  Occupational History   Not on file  Tobacco Use   Smoking status: Never   Smokeless tobacco: Never  Vaping Use   Vaping Use: Never used  Substance and Sexual Activity   Alcohol use: Never   Drug use: Never   Sexual activity: Not on file  Other Topics Concern   Not on file  Social History Narrative   Not on file   Social Determinants of Health   Financial Resource Strain: Not on file  Food Insecurity: Not on file  Transportation Needs: Not on file  Physical Activity: Not on file  Stress: Not on file  Social Connections: Not on file  Intimate Partner Violence: Not on file     Allergies  Allergen Reactions   Penicillins      Outpatient Medications Prior to Visit  Medication Sig Dispense Refill   albuterol (VENTOLIN HFA) 108 (90 Base) MCG/ACT inhaler Inhale 2 puffs into the lungs every 4 (four) hours as needed.     rizatriptan (MAXALT) 10 MG tablet Take 1 tablet (10 mg total) by mouth as needed for migraine. May repeat in 2 hours if needed 10 tablet 3   No facility-administered medications prior to visit.       Objective:   Physical Exam:  General appearance: 21 y.o., adult, NAD, conversant  Eyes: anicteric sclerae; PERRL, tracking appropriately HENT: NCAT; MMM Neck: Trachea midline; no lymphadenopathy, no JVD Lungs: CTAB, no crackles, no wheeze, with normal  respiratory effort CV: RRR, no murmur  Abdomen: Soft, non-tender; non-distended, BS present  Extremities: No peripheral edema, warm Skin: Normal turgor and texture; no rash Psych: Appropriate affect Neuro: Alert and oriented to person and place, no focal deficit     There were no vitals filed for this visit.   on *** LPM *** RA BMI Readings from Last 3 Encounters:  09/17/21 25.24 kg/m (80 %, Z= 0.85)*  08/28/21 25.24 kg/m (80 %, Z= 0.86)*  08/10/21 23.78 kg/m (71 %, Z= 0.56)*   * Growth percentiles are based on CDC (Girls, 2-20 Years) data.   Wt Readings from Last 3 Encounters:  09/17/21 138 lb (62.6 kg) (67 %, Z= 0.44)*  08/28/21 138 lb (62.6 kg) (67 %, Z= 0.45)*  08/10/21 130 lb (59 kg) (55 %, Z= 0.12)*   * Growth percentiles are based on CDC (Girls, 2-20 Years) data.     CBC    Component Value Date/Time   WBC 8.0 09/17/2021 1804   RBC 5.12 (H) 09/17/2021 1804   HGB 15.4 (H) 09/17/2021 1804   HCT 44.6 09/17/2021 1804   PLT 339 09/17/2021 1804   MCV 87.1 09/17/2021 1804   MCH 30.1 09/17/2021 1804   MCHC 34.5 09/17/2021 1804   RDW 12.3 09/17/2021 1804   LYMPHSABS 1.9 08/10/2021 1829  MONOABS 0.5 08/10/2021 1829   EOSABS 0.0 08/10/2021 1829   BASOSABS 0.1 08/10/2021 1829    ***  Chest Imaging: CXR 09/17/21 reviewed by me unremarkable  Pulmonary Functions Testing Results:     No data to display          FeNO: ***  Pathology: ***  Echocardiogram: ***  Heart Catheterization: ***    Assessment & Plan:    Plan:      Omar Person, MD [facility] 01/25/2022 8:15 PM

## 2022-01-28 ENCOUNTER — Encounter: Payer: Self-pay | Admitting: Student

## 2022-01-28 ENCOUNTER — Ambulatory Visit (INDEPENDENT_AMBULATORY_CARE_PROVIDER_SITE_OTHER): Admitting: Student

## 2022-01-28 VITALS — BP 112/68 | HR 90 | Temp 98.1°F | Ht 62.0 in | Wt 142.0 lb

## 2022-01-28 DIAGNOSIS — R0609 Other forms of dyspnea: Secondary | ICD-10-CM | POA: Diagnosis not present

## 2022-01-28 NOTE — Patient Instructions (Signed)
-   albuterol 1-2 puffs as needed or 5-20 minutes before heavy exertion - we will schedule PFTs and clinic visit right after

## 2022-02-07 ENCOUNTER — Encounter (HOSPITAL_COMMUNITY): Payer: Self-pay | Admitting: *Deleted

## 2022-02-07 ENCOUNTER — Ambulatory Visit (INDEPENDENT_AMBULATORY_CARE_PROVIDER_SITE_OTHER)

## 2022-02-07 ENCOUNTER — Ambulatory Visit (HOSPITAL_COMMUNITY): Admission: EM | Admit: 2022-02-07 | Discharge: 2022-02-07 | Disposition: A | Discharge status: Home / Self Care

## 2022-02-07 DIAGNOSIS — S93491A Sprain of other ligament of right ankle, initial encounter: Secondary | ICD-10-CM | POA: Diagnosis not present

## 2022-02-07 DIAGNOSIS — M25571 Pain in right ankle and joints of right foot: Secondary | ICD-10-CM | POA: Diagnosis not present

## 2022-02-07 MED ORDER — NAPROXEN 500 MG PO TABS: 500.0000 mg | ORAL_TABLET | Freq: Two times a day (BID) | ORAL | 0 refills | Status: AC

## 2022-02-07 NOTE — Discharge Instructions (Addendum)
Your x-ray is negative. You have an ankle sprain. Please wear the supportive ankle stabilizing brace for the next 3 to 4 weeks. Please avoid heat, and ice your ankle 3 times daily for the next 3 days. Please take the anti-inflammatory medication prescribed today twice daily with food. Please start ankle sprain phase 1 rehab exercises attached to this handout Follow up with sports medicine in 3 weeks if symptoms persist.

## 2022-02-07 NOTE — ED Provider Notes (Signed)
[facility]    CSN: [id number] Arrival date & time: 02/07/22  1816      History   Chief Complaint Chief Complaint  Patient presents with   Ankle Pain    HPI Mindy Woodward is a 21 y.o. adult.   Pleasant 21 year old female presents today due to concern of right ankle pain.  She states yesterday she tripped on a curb and hurt her ankle.  Last evening she took an anti-inflammatory and tried heat to the ankle.  She was able to bear weight minimally this morning, but late morning was unable to stand while at school, and had to sit with her leg raised.  She states her significant swelling and bruising to her lateral right ankle and very tender over the lateral malleolus.  She denies any prior ankle issues.  Reports full sensation.   Ankle Pain   Past Medical History:  Diagnosis Date   Asthma    Photophobia     There are no problems to display for this patient.   History reviewed. No pertinent surgical history.  OB History   No obstetric history on file.      Home Medications    Prior to Admission medications   Medication Sig Start Date End Date Taking? Authorizing Provider  albuterol (VENTOLIN HFA) 108 (90 Base) MCG/ACT inhaler Inhale 2 puffs into the lungs every 4 (four) hours as needed. 07/12/21  Yes [provider]  etonogestrel (NEXPLANON) 68 MG IMPL implant 1 each by Subdermal route once.   Yes [provider]  naproxen (NAPROSYN) 500 MG tablet Take 1 tablet (500 mg total) by mouth 2 (two) times daily with a meal for 10 days. 02/07/22 02/17/22 Yes Markeese Boyajian L, PA  rizatriptan (MAXALT) 10 MG tablet Take 1 tablet (10 mg total) by mouth as needed for migraine. May repeat in 2 hours if needed 08/28/21  Yes Ocie Doyne, MD    Family History History reviewed. No pertinent family history.  Social History Social History   Tobacco Use   Smoking status: Never   Smokeless tobacco: Never  Vaping Use   Vaping Use: Never used   Substance Use Topics   Alcohol use: Never   Drug use: Never     Allergies   Penicillins   Review of Systems Review of Systems  Musculoskeletal:  Positive for joint swelling and myalgias.  All other systems reviewed and are negative.    Physical Exam Triage Vital Signs ED Triage Vitals  Enc Vitals Group     BP 02/07/22 1927 99/67     Pulse Rate 02/07/22 1927 72     Resp 02/07/22 1927 18     Temp 02/07/22 1927 98.7 F (37.1 C)     Temp Source 02/07/22 1927 Oral     SpO2 02/07/22 1927 98 %     Weight --      Height --      Head Circumference --      Peak Flow --      Pain Score 02/07/22 1925 8     Pain Loc --      Pain Edu? --      Excl. in GC? --    No data found.  Updated Vital Signs BP 99/67 (BP Location: Left Arm)   Pulse 72   Temp 98.7 F (37.1 C) (Oral)   Resp 18   SpO2 98%   Visual Acuity Right Eye Distance:   Left Eye Distance:   Bilateral Distance:  Right Eye Near:   Left Eye Near:    Bilateral Near:     Physical Exam Vitals and nursing note reviewed.  Constitutional:      General: Mindy Woodward is not in acute distress.    Appearance: Normal appearance. Mindy Woodward is not ill-appearing or toxic-appearing.     Comments: Pt sitting in wheelchair  HENT:     Head: Normocephalic and atraumatic.  Cardiovascular:     Rate and Rhythm: Normal rate.     Pulses: Normal pulses.  Pulmonary:     Effort: Pulmonary effort is normal. No respiratory distress.  Musculoskeletal:        General: Swelling, tenderness and signs of injury present. No deformity. Normal range of motion.     Comments: Moderate ankle swelling Bruising superior to lateral malleolus Tenderness to lateral malleolus only, no other tenderness noted Pain with plantar flexion, ROM normal  Skin:    General: Skin is warm.     Capillary Refill: Capillary refill takes less than 2 seconds.     Findings: No erythema or rash.  Neurological:     General: No focal deficit present.      Mental Status: Mindy Woodward is alert and oriented to person, place, and time.     Sensory: No sensory deficit.     Motor: No weakness.  Psychiatric:        Mood and Affect: Mood normal.      UC Treatments / Results  Labs (all labs ordered are listed, but only abnormal results are displayed) Labs Reviewed - No data to display  EKG   Radiology DG Ankle Complete Right  Result Date: 02/07/2022 CLINICAL DATA:  Twisting injury right ankle. EXAM: RIGHT ANKLE - COMPLETE 3+ VIEW COMPARISON:  None Available. FINDINGS: There is no evidence of fracture, dislocation, or joint effusion. There is no evidence of arthropathy or other focal bone abnormality. There is mild soft tissue swelling laterally. IMPRESSION: Soft tissue swelling without evidence of fractures. Electronically Signed   By: Almira Bar M.D.   On: 02/07/2022 20:23    Procedures Procedures (including critical care time)  Medications Ordered in UC Medications - No data to display  Initial Impression / Assessment and Plan / UC Course  I have reviewed the triage vital signs and the nursing notes.  Pertinent labs & imaging results that were available during my care of the patient were reviewed by me and considered in my medical decision making (see chart for details).     R ankle sprain -x-ray negative for acute fractures.  Patient placed in ankle stabilizing brace, instructed to ice her ankle and take NSAIDs scheduled with food for the next week.  Ankle sprain rehab exercises attached to discharge instructions.  Follow-up with sports med should symptoms persist greater than 3 weeks.   Final Clinical Impressions(s) / UC Diagnoses   Final diagnoses:  Sprain of anterior talofibular ligament of right ankle, initial encounter     Discharge Instructions      Your x-ray is negative. You have an ankle sprain. Please wear the supportive ankle stabilizing brace for the next 3 to 4 weeks. Please avoid heat, and ice your  ankle 3 times daily for the next 3 days. Please take the anti-inflammatory medication prescribed today twice daily with food. Please start ankle sprain phase 1 rehab exercises attached to this handout Follow up with sports medicine in 3 weeks if symptoms persist.     ED Prescriptions  Medication Sig Dispense Auth. Provider   naproxen (NAPROSYN) 500 MG tablet Take 1 tablet (500 mg total) by mouth 2 (two) times daily with a meal for 10 days. 20 tablet Lindsie Simar L, [place]      PDMP not reviewed this encounter.   Chaney Malling, [place] 02/07/22 2036

## 2022-02-07 NOTE — ED Triage Notes (Signed)
Pt said she tripped and fell yesterday over a curb and she twisted her right ankle. She tried to use heat and elevation but it didn't help.

## 2022-03-05 ENCOUNTER — Ambulatory Visit (INDEPENDENT_AMBULATORY_CARE_PROVIDER_SITE_OTHER): Admitting: Psychiatry

## 2022-03-05 ENCOUNTER — Telehealth: Payer: Self-pay | Admitting: Psychiatry

## 2022-03-05 ENCOUNTER — Encounter: Payer: Self-pay | Admitting: Psychiatry

## 2022-03-05 VITALS — BP 106/67 | HR 81 | Ht 62.0 in | Wt 149.4 lb

## 2022-03-05 DIAGNOSIS — G44319 Acute post-traumatic headache, not intractable: Secondary | ICD-10-CM | POA: Diagnosis not present

## 2022-03-05 DIAGNOSIS — G43009 Migraine without aura, not intractable, without status migrainosus: Secondary | ICD-10-CM | POA: Diagnosis not present

## 2022-03-05 MED ORDER — AMITRIPTYLINE HCL 25 MG PO TABS: ORAL_TABLET | ORAL | 6 refills | Status: DC

## 2022-03-05 NOTE — Progress Notes (Signed)
   CC:  headaches  Follow-up Visit  Last visit: 08/28/21  Brief HPI: 21 year old nonbinary patient with a history of asthma who follows in clinic for migraines. MRI brain and CTA head/neck in April 2023 were unremarkable. At their last visit they were started on Maxalt for migraine rescue.  Interval History: In August they fell down the stairs and hit their head. Afterwards they developed trouble focusing and bad headaches. They have been falling behind in class due to concentration difficulty. Had to drop 2 classes. They also report trouble sleeping since hitting their head. Continues to have frequent headaches, which are exacerbated by focusing on their school work. Maxalt does help for rescue.  Headache days per month: 15 Headache free days per month: 15  Current Headache Regimen: Preventative: none Abortive: Maxalt 10 mg PRN   Prior Therapies                                  Maxalt 10 mg PRN  Physical Exam:   Vital Signs: BP 106/67   Pulse 81   Ht 5\' 2"  (1.575 m)   Wt 149 lb 6.4 oz (67.8 kg)   BMI 27.33 kg/m  GENERAL:  well appearing, in no acute distress, alert  SKIN:  Color, texture, turgor normal. No rashes or lesions HEAD:  Normocephalic/atraumatic. RESP: normal respiratory effort MSK:  No gross joint deformities.   NEUROLOGICAL: Mental Status: Alert, oriented to person, place and time, Follows commands, and Speech fluent and appropriate. Cranial Nerves: PERRL, face symmetric, no dysarthria, hearing grossly intact Motor: moves all extremities equally Gait: normal-based.  IMPRESSION: 21 year old nonbinary patient who presents for follow up of headaches. They report worsening concentration issues and headaches following a head trauma in August. Will order [facility] for persistent post-traumatic headaches. Will start amitriptyline for headaches and insomnia, and continue Maxalt for migraine rescue.  PLAN: -CTH -Prevention: Start amitriptyline 12.5 mg QHS x1 week, then  increase to 25 mg QHS -Rescue: Continue Maxalt 10 mg PRN   Follow-up: 3 months  I spent a total of 25 minutes on the date of the service. Headache education was done. Discussed treatment options including preventive and acute medications. Discussed medication side effects, adverse reactions and drug interactions. Written educational materials and patient instructions outlining all of the above were given.  Genia Harold, MD 03/05/22 1:57 PM

## 2022-03-05 NOTE — Patient Instructions (Addendum)
Start amitriptyline for headache prevention. Take 1/2 pill at bedtime for one week, then increase to 1 pill at bedtime.  CT of the brain  Post Concussive Syndrome:  Post-concussion syndrome is a complex disorder in which various symptoms -- such as headaches and dizziness -- last for weeks and sometimes months after the injury that caused the concussion. Concussion is a mild traumatic brain injury, usually occurring after a blow to the head. Loss of consciousness isn't required for a diagnosis of concussion or post-concussion syndrome. In fact, the risk of post-concussion syndrome doesn't appear to be associated with the severity of the initial injury. In most people, post-concussion syndrome symptoms occur within the first seven to 10 days and go away within three months, though they can persist for a year or more. Post-concussion syndrome treatments are aimed at easing specific symptoms.  Post-concussion symptoms include: Headaches  Dizziness  Fatigue  Irritability  Anxiety  Insomnia  Loss of concentration and memory  Noise and light sensitivity Headaches that occur after a concussion can vary and may feel like tension-type headaches or migraines. Most, however, are tension-type headaches, which may be associated with a neck injury that happened at the same time as the head injury. In some cases, people experience behavior or emotional changes after a mild traumatic brain injury. Family members may notice that the person has become more irritable, suspicious, argumentative or stubborn.  When to see a doctor See a doctor if you experience a head injury severe enough to cause confusion or amnesia -- even if you never lost consciousness. If a concussion occurs while you're playing a sport, don't go back in the game. Seek medical attention so that you don't risk worsening your injury.  Causes: In many cases, both physiological effects of brain trauma and emotional reactions to these effects  play a role in the development of symptoms. Researchers haven't determined why some people who've had concussions develop persistent post-concussion symptoms while others do not. No proven correlation between the severity of the injury and the likelihood of developing persistent post-concussion symptoms exists.  Risk Factors: Risk factors for developing post-concussion syndrome include: Age. Studies have found increasing age to be a risk factor for post-concussion syndrome.  Sex. Women are more likely to be diagnosed with post-concussion syndrome, but this may be because women are generally more likely to seek medical care.  Trauma. Concussions resulting from car collisions, falls, assaults and sports injuries are commonly associated with post-concussion syndrome.  Treatment: There is no specific treatment for post-concussion syndrome. Instead, your doctor will treat the individual symptoms you're experiencing. The types of symptoms and their frequency are unique to each person. Headaches Medications commonly used for migraines or tension headaches, including some antidepressants, appear to be effective when these types of headaches are associated with post-concussion syndrome. Examples include: Amitriptyline. This medication has been widely used for post-traumatic injuries, as well as for symptoms commonly associated with post-concussion syndrome, such as irritability, dizziness and depression. Topiramate. Commonly used to treat migraines, topiramate (Qudexy XR, Topamax, Trokendi XR) may be effective in reducing headaches after head injury. Common side effects of topiramate include weight loss and cognitive problems.  Gabapentin. Gabapentin (Gralise, Neurontin) is frequently used to treat a variety of types of pain and may be helpful in treating post-traumatic headaches. A common side effect of gabapentin is drowsiness.  Other agents used to treat migraines and tension-type headaches may also be  helpful in some individuals. Keep in mind that the overuse of over-the-counter  and prescription pain relievers may contribute to persistent post-concussion headaches.  Memory and thinking problems No medications are currently recommended specifically for the treatment of cognitive problems after mild traumatic brain injury. Time may be the best therapy for post-concussion syndrome if you have cognitive problems, as most of them go away on their own in the weeks to months following the injury. Certain forms of cognitive therapy may be helpful, including focused rehabilitation that provides training in how to use a pocket calendar, Librarian, academic or other techniques to work around memory deficits and attention skills. Relaxation therapy also may help. Dizziness Vestibular rehab (a specialized form of physical therapy can help this. Depression and anxiety The symptoms of post-concussion syndrome often improve after the affected person learns that there is a cause for his or her symptoms and that they will likely improve with time. Education about the disorder can ease a person's fears and help provide peace of mind. If you're experiencing new or increasing depression or anxiety after a concussion, some treatment options include: Psychotherapy. It may be helpful to discuss your concerns with a psychologist or psychiatrist who has experience in working with people with brain injury.  Medication. To combat anxiety or depression, antidepressants or anti-anxiety medications may be prescribed. Prevention: The only known way to prevent post-concussion syndrome is to avoid the head injury in the first place. Avoiding head injuries Although you can't prepare for every potential situation, here are some tips for avoiding common causes of head injuries: Fasten your seat belt whenever you're traveling in a car, and be sure children are in age-appropriate safety seats. Children under 13 are safest riding in  the back seat, especially if your car has air bags.  Use helmets whenever you or your children are bicycling, roller-skating, in-line skating, ice-skating, skiing, snowboarding, playing football, batting or running the bases in softball or baseball, skateboarding, or horseback riding. Wear a helmet when riding a motorcycle.  Take steps around the house to prevent falls, such as removing small area rugs, improving lighting and installing handrails.

## 2022-03-05 NOTE — Telephone Encounter (Signed)
[employer] [facility] sent to [facility] [phone] 

## 2022-04-02 ENCOUNTER — Ambulatory Visit (INDEPENDENT_AMBULATORY_CARE_PROVIDER_SITE_OTHER): Admitting: Primary Care

## 2022-04-02 ENCOUNTER — Encounter: Payer: Self-pay | Admitting: Primary Care

## 2022-04-02 ENCOUNTER — Ambulatory Visit: Admitting: Student

## 2022-04-02 VITALS — BP 122/68 | HR 138 | Temp 100.2°F | Ht 63.0 in | Wt 150.0 lb

## 2022-04-02 DIAGNOSIS — R0609 Other forms of dyspnea: Secondary | ICD-10-CM | POA: Diagnosis not present

## 2022-04-02 DIAGNOSIS — J4520 Mild intermittent asthma, uncomplicated: Secondary | ICD-10-CM | POA: Diagnosis not present

## 2022-04-02 DIAGNOSIS — R519 Headache, unspecified: Secondary | ICD-10-CM | POA: Diagnosis not present

## 2022-04-02 DIAGNOSIS — J4530 Mild persistent asthma, uncomplicated: Secondary | ICD-10-CM | POA: Insufficient documentation

## 2022-04-02 LAB — PULMONARY FUNCTION TEST
DL/VA % pred: 133 %
DL/VA: 6.48 ml/min/mmHg/L
DLCO cor % pred: 124 %
DLCO cor: 25.53 ml/min/mmHg
DLCO unc % pred: 124 %
DLCO unc: 25.53 ml/min/mmHg
FEF 25-75 Post: 2.26 L/sec
FEF 25-75 Pre: 2.67 L/sec
FEF2575-%Change-Post: -15 %
FEF2575-%Pred-Post: 62 %
FEF2575-%Pred-Pre: 73 %
FEV1-%Change-Post: -7 %
FEV1-%Pred-Post: 77 %
FEV1-%Pred-Pre: 84 %
FEV1-Post: 2.44 L
FEV1-Pre: 2.63 L
FEV1FVC-%Change-Post: 0 %
FEV1FVC-%Pred-Pre: 98 %
FEV6-%Change-Post: -9 %
FEV6-%Pred-Post: 79 %
FEV6-%Pred-Pre: 87 %
FEV6-Post: 2.81 L
FEV6-Pre: 3.1 L
FEV6FVC-%Pred-Post: 99 %
FEV6FVC-%Pred-Pre: 99 %
FVC-%Change-Post: -7 %
FVC-%Pred-Post: 80 %
FVC-%Pred-Pre: 87 %
FVC-Post: 2.85 L
FVC-Pre: 3.1 L
Post FEV1/FVC ratio: 85 %
Post FEV6/FVC ratio: 100 %
Pre FEV1/FVC ratio: 85 %
Pre FEV6/FVC Ratio: 100 %
RV % pred: 102 %
RV: 1.11 L
TLC % pred: 87 %
TLC: 4.15 L

## 2022-04-02 LAB — NITRIC OXIDE: Nitric Oxide: 17

## 2022-04-02 MED ORDER — FLUTICASONE-SALMETEROL 100-50 MCG/ACT IN AEPB: 1.0000 | INHALATION_SPRAY | Freq: Two times a day (BID) | RESPIRATORY_TRACT | 0 refills | Status: DC

## 2022-04-02 NOTE — Progress Notes (Signed)
Full PFT performed today. °

## 2022-04-02 NOTE — Patient Instructions (Signed)
Full PFT performed today. °

## 2022-04-02 NOTE — Progress Notes (Signed)
@Patient  ID: Mindy Woodward, adult    DOB: 2001/03/05, 21 y.o.   MRN: [id number]  Chief Complaint  Patient presents with   Follow-up    Referring provider: [id number], PA-C  HPI:  20y with no PMH asthma, eczema  Winded walking around campus with band-like sensation around chest since 04/2021. She has used albuterol which she thinks sometimes helps. Has never had course of prednisone for this. Has not tried any other inhalers. She has sinus congestion sometimes at night, occasional postnasal drainage.   Otherwise pertinent review of systems is negative.  She has no family history of lung disease  She is a 05/2021 at [employer]. She has smoked MJ once. No vaping. She has 2 dogs.    04/02/2022 Patient presents today for follow-up for asthma. She has symptoms of shortness of breath and chest tightness with exercise. Symptoms occur at times when laughing. She also has an occasional dry cough She uses Albuterol a couple times a week and feels it helps.   She is following with neurology for slurred speech and headache. She sustained a concussion in August. She last saw neurology in October 2023. She was placed on amitriptyline which has helped with headaches. She is scheduled for CT head scheduled for 04/09/22.     Allergies  Allergen Reactions   Penicillins     Family member passed from taking PCN    Immunization History  Administered Date(s) Administered   PFIZER(Purple Top)SARS-COV-2 Vaccination 08/25/2019, 09/14/2019, 05/17/2020   Pneumococcal-Unspecified 04/26/2002, 07/01/2002, 02/22/2003, 08/26/2003   Tdap 02/08/2013, 01/10/2015    Past Medical History:  Diagnosis Date   Asthma    Headache    Photophobia     Tobacco History: Social History   Tobacco Use  Smoking Status Never   Passive exposure: Never  Smokeless Tobacco Never   Counseling given: Not Answered   Outpatient Medications Prior to Visit  Medication Sig Dispense Refill    albuterol (VENTOLIN HFA) 108 (90 Base) MCG/ACT inhaler Inhale 2 puffs into the lungs every 4 (four) hours as needed.     amitriptyline (ELAVIL) 25 MG tablet Take 1/2 pill at bedtime for one week, then increase to 1 pill at bedtime and stay on that dose 30 tablet 6   etonogestrel (NEXPLANON) 68 MG IMPL implant 1 each by Subdermal route once.     rizatriptan (MAXALT) 10 MG tablet Take 1 tablet (10 mg total) by mouth as needed for migraine. May repeat in 2 hours if needed 10 tablet 3   No facility-administered medications prior to visit.    Review of Systems  Review of Systems  Constitutional: Negative.   HENT: Negative.    Respiratory:  Positive for chest tightness and shortness of breath.   Neurological:  Positive for headaches.   Physical Exam  BP 122/68 (BP Location: Right Arm, Patient Position: Sitting, Cuff Size: Normal)   Pulse (!) 138   Temp 100.2 F (37.9 C) (Oral)   Ht 5\' 3"  (1.6 m)   Wt 150 lb (68 kg)   SpO2 96%   BMI 26.57 kg/m  Physical Exam Constitutional:      Appearance: Normal appearance.  HENT:     Head: Normocephalic and atraumatic.  Cardiovascular:     Rate and Rhythm: Regular rhythm. Tachycardia present.  Pulmonary:     Effort: Pulmonary effort is normal.     Breath sounds: Normal breath sounds. No wheezing, rhonchi or rales.  Musculoskeletal:  General: Normal range of motion.     Cervical back: Normal range of motion and neck supple.  Skin:    General: Skin is warm and dry.  Neurological:     General: No focal deficit present.     Mental Status: Mindy Woodward is alert and oriented to person, place, and time.     Comments: Pressured speech  Psychiatric:        Mood and Affect: Mood normal.        Behavior: Behavior normal.        Thought Content: Thought content normal.        Judgment: Judgment normal.     Comments: Somewhat anxious      Lab Results:  CBC    Component Value Date/Time   WBC 8.0 09/17/2021 1804   RBC 5.12 (H)  09/17/2021 1804   HGB 15.4 (H) 09/17/2021 1804   HCT 44.6 09/17/2021 1804   PLT 339 09/17/2021 1804   MCV 87.1 09/17/2021 1804   MCH 30.1 09/17/2021 1804   MCHC 34.5 09/17/2021 1804   RDW 12.3 09/17/2021 1804   LYMPHSABS 1.9 08/10/2021 1829   MONOABS 0.5 08/10/2021 1829   EOSABS 0.0 08/10/2021 1829   BASOSABS 0.1 08/10/2021 1829    BMET    Component Value Date/Time   NA 140 09/17/2021 1804   K 3.9 09/17/2021 1804   CL 105 09/17/2021 1804   CO2 26 09/17/2021 1804   GLUCOSE 104 (H) 09/17/2021 1804   BUN 14 09/17/2021 1804   CREATININE 0.60 09/17/2021 1804   CALCIUM 10.2 09/17/2021 1804   GFRNONAA >60 09/17/2021 1804    BNP No results found for: "BNP"  ProBNP No results found for: "PROBNP"  Imaging: No results found.   Assessment & Plan:   Mild intermittent asthma - Pulmonary function testing today with normal spirometry and lung volumes.  Clinical symptoms consistent with mild intermittent asthma.  Recommend patient trial low-dose ICS/LABA.  We will send in a prescription for Advair 100-44mcg one puff every 12 hours. FU in 4 weeks with Dr. Thora Lance.   Frequent headaches - Following with neurology. She sustained concussion in August. She is on Amitriptyline with improvement. Scheduled for CT head November 21st.    Glenford Bayley, NP 04/07/2022

## 2022-04-02 NOTE — Patient Instructions (Addendum)
Pulmonary function test showed normal lung function  Clinical symptoms suggestive of asthma, if not better with inhaler may want you to see ENT for upper airway exam   Continue to follow with neurology and have CT head as scheduled   Recommendations: Start Advair - take one puff morning and evening until follow-up (rinse mouth after use)  Rx: Advair   Follow-up: December with Dr. Thora Lance

## 2022-04-02 NOTE — Assessment & Plan Note (Addendum)
-   Pulmonary function testing today with normal spirometry and lung volumes.  Clinical symptoms consistent with mild intermittent asthma.  Recommend patient trial low-dose ICS/LABA.  We will send in a prescription for Advair 100-71mcg one puff every 12 hours. FU in 4 weeks with Dr. Thora Lance.

## 2022-04-07 DIAGNOSIS — R519 Headache, unspecified: Secondary | ICD-10-CM | POA: Insufficient documentation

## 2022-04-07 NOTE — Assessment & Plan Note (Signed)
-   Following with neurology. She sustained concussion in August. She is on Amitriptyline with improvement. Scheduled for CT head November 21st.

## 2022-04-08 ENCOUNTER — Other Ambulatory Visit: Payer: Self-pay | Admitting: *Deleted

## 2022-04-08 MED ORDER — FLUTICASONE-SALMETEROL 100-50 MCG/ACT IN AEPB: 1.0000 | INHALATION_SPRAY | Freq: Two times a day (BID) | RESPIRATORY_TRACT | 11 refills | Status: DC

## 2022-04-09 ENCOUNTER — Ambulatory Visit
Admission: RE | Admit: 2022-04-09 | Discharge: 2022-04-09 | Disposition: A | Discharge status: Home / Self Care | Source: Ambulatory Visit | Attending: Psychiatry | Admitting: Psychiatry

## 2022-04-09 DIAGNOSIS — G44319 Acute post-traumatic headache, not intractable: Secondary | ICD-10-CM

## 2022-04-29 ENCOUNTER — Other Ambulatory Visit: Payer: Self-pay | Admitting: Primary Care

## 2022-04-29 NOTE — Progress Notes (Unsigned)
Synopsis: Referred for dyspnea by Light, Alyssa B, PA-C  Subjective:   PATIENT ID: Mindy Woodward GENDER: nonbinary DOB: 08-Jan-2001, MRN: [id number]  No chief complaint on file.  20y with no PMH asthma, eczema  Winded walking around campus with band-like sensation around chest since 04/2021. She has used albuterol which she thinks sometimes helps. Has never had course of prednisone for this. Has not tried any other inhalers. She has sinus congestion sometimes at night, occasional postnasal drainage.     She has no family history of lung disease  She is a Consulting civil engineer at [employer]. She has smoked MJ once. No vaping. She has 2 dogs.   Interval HPI  Started on trial advair with Clent Ridges 11/14  PFT 11/14 with elevated diffusing capacity and flattening of FV loops  Otherwise pertinent review of systems is negative. Past Medical History:  Diagnosis Date   Asthma    Headache    Photophobia      No family history on file.   No past surgical history on file.  Social History   Socioeconomic History   Marital status: Single    Spouse name: Not on file   Number of children: 0   Years of education: Not on file   Highest education level: Not on file  Occupational History    Comment: student [employer]  Tobacco Use   Smoking status: Never    Passive exposure: Never   Smokeless tobacco: Never  Vaping Use   Vaping Use: Never used  Substance and Sexual Activity   Alcohol use: Never   Drug use: Never   Sexual activity: Not on file  Other Topics Concern   Not on file  Social History Narrative   Not on file   Social Determinants of Health   Financial Resource Strain: Not on file  Food Insecurity: Not on file  Transportation Needs: Not on file  Physical Activity: Not on file  Stress: Not on file  Social Connections: Not on file  Intimate Partner Violence: Not on file     Allergies  Allergen Reactions   Penicillins     Family member passed from taking PCN      Outpatient Medications Prior to Visit  Medication Sig Dispense Refill   albuterol (VENTOLIN HFA) 108 (90 Base) MCG/ACT inhaler Inhale 2 puffs into the lungs every 4 (four) hours as needed.     amitriptyline (ELAVIL) 25 MG tablet Take 1/2 pill at bedtime for one week, then increase to 1 pill at bedtime and stay on that dose 30 tablet 6   etonogestrel (NEXPLANON) 68 MG IMPL implant 1 each by Subdermal route once.     fluticasone-salmeterol (WIXELA INHUB) 100-50 MCG/ACT AEPB INHALE 1 PUFF INTO THE LUNGS TWICE DAILY 180 each 3   rizatriptan (MAXALT) 10 MG tablet Take 1 tablet (10 mg total) by mouth as needed for migraine. May repeat in 2 hours if needed 10 tablet 3   No facility-administered medications prior to visit.       Objective:   Physical Exam:  General appearance: 21 y.o., adult, NAD, conversant  Eyes: anicteric sclerae; PERRL, tracking appropriately HENT: NCAT; MMM Neck: Trachea midline; no lymphadenopathy, no JVD Lungs: CTAB, no crackles, no wheeze, with normal respiratory effort CV: RRR, no murmur  Abdomen: Soft, non-tender; non-distended, BS present  Extremities: No peripheral edema, warm Skin: Normal turgor and texture; no rash Psych: Appropriate affect Neuro: Alert and oriented to person and place, no focal deficit  There were no vitals filed for this visit.    on RA BMI Readings from Last 3 Encounters:  04/02/22 26.57 kg/m  03/05/22 27.33 kg/m  01/28/22 25.97 kg/m (83 %, Z= 0.96)*   * Growth percentiles are based on CDC (Girls, 2-20 Years) data.   Wt Readings from Last 3 Encounters:  04/02/22 150 lb (68 kg)  03/05/22 149 lb 6.4 oz (67.8 kg)  01/28/22 142 lb (64.4 kg) (71 %, Z= 0.57)*   * Growth percentiles are based on CDC (Girls, 2-20 Years) data.     CBC    Component Value Date/Time   WBC 8.0 09/17/2021 1804   RBC 5.12 (H) 09/17/2021 1804   HGB 15.4 (H) 09/17/2021 1804   HCT 44.6 09/17/2021 1804   PLT 339 09/17/2021 1804   MCV 87.1  09/17/2021 1804   MCH 30.1 09/17/2021 1804   MCHC 34.5 09/17/2021 1804   RDW 12.3 09/17/2021 1804   LYMPHSABS 1.9 08/10/2021 1829   MONOABS 0.5 08/10/2021 1829   EOSABS 0.0 08/10/2021 1829   BASOSABS 0.1 08/10/2021 1829    Chest Imaging: CXR 09/17/21 reviewed by me unremarkable  Pulmonary Functions Testing Results:    Latest Ref Rng & Units 04/02/2022    1:49 PM  PFT Results  FVC-Pre L 3.10   FVC-Predicted Pre % 87   FVC-Post L 2.85   FVC-Predicted Post % 80   Pre FEV1/FVC % % 85   Post FEV1/FCV % % 85   FEV1-Pre L 2.63   FEV1-Predicted Pre % 84   FEV1-Post L 2.44   DLCO uncorrected ml/min/mmHg 25.53   DLCO UNC% % 124   DLCO corrected ml/min/mmHg 25.53   DLCO COR %Predicted % 124   DLVA Predicted % 133   TLC L 4.15   TLC % Predicted % 87   RV % Predicted % 102       Assessment & Plan:   # DOE # Chest tightness Partial relief with albuterol, uncontrolled asthma certainly possible. Good air movement without wheeze today.  Plan: - pre/post BD spiro, then clinic visit right after with myself or APP - albuterol 1-2 puffs as needed or 5-20 minutes before heavy exertion     Omar Person, MD [facility] 04/29/2022 6:50 PM

## 2022-05-01 ENCOUNTER — Encounter: Payer: Self-pay | Admitting: Student

## 2022-05-01 ENCOUNTER — Ambulatory Visit: Admitting: Student

## 2022-05-01 VITALS — BP 114/74 | HR 104 | Temp 98.3°F | Ht 62.0 in | Wt 154.0 lb

## 2022-05-01 DIAGNOSIS — J4530 Mild persistent asthma, uncomplicated: Secondary | ICD-10-CM | POA: Diagnosis not present

## 2022-05-01 NOTE — Patient Instructions (Signed)
-   wixela 1 puff twice daily rinse mouth, brush teeth/tongue after use - albuterol as needed - this is your rescue inhaler - let me know if you'd like me to place referral to be seen by ENT to inspect vocal cords due to abnormality on your flow-volume loops on PFTs - see you in 3 months or sooner if need be!

## 2022-06-03 ENCOUNTER — Encounter: Payer: Self-pay | Admitting: Adult Health

## 2022-06-03 ENCOUNTER — Ambulatory Visit: Admitting: Adult Health

## 2022-06-03 VITALS — BP 111/70 | HR 99 | Ht 62.0 in | Wt 148.0 lb

## 2022-06-03 DIAGNOSIS — G43009 Migraine without aura, not intractable, without status migrainosus: Secondary | ICD-10-CM | POA: Diagnosis not present

## 2022-06-03 MED ORDER — AMITRIPTYLINE HCL 25 MG PO TABS: 25.0000 mg | ORAL_TABLET | Freq: Every day | ORAL | 3 refills | Status: DC

## 2022-06-03 NOTE — Patient Instructions (Addendum)
Your Plan:  Continue amitriptyline 25 mg nightly for headache prevention  Use of rizatriptan as needed for rescue    Follow-up in 7 months or call earlier if needed     Thank you for coming to see [place] at [facility]. I hope we have been able to provide you high quality care today.  You may receive a patient satisfaction survey over the next few weeks. We would appreciate your feedback and comments so that we may continue to improve ourselves and the health of our patients.

## 2022-06-03 NOTE — Progress Notes (Signed)
   CC:  headaches Chief Complaint  Patient presents with   Follow-up    RM 2 alone Pt is well, migraines have improved since last visit     Follow-up Visit  Last visit: 03/05/2022 with Dr. Billey Gosling  Brief HPI: 22 year old nonbinary patient with a history of asthma who follows in clinic for migraines. MRI brain and CTA head/neck in April 2023 were unremarkable.   At prior visit, started on amitriptyline for headaches and insomnia and continued Maxalt for rescue. [place] completed for persistent posttraumatic headaches which was unremarkable.    Interval History:  Reports improvement of migraines since prior visit being amitriptyline, having about 1 migraine per month. Used maxalt which helped.  Amitriptyline did help sleep as well. Focus and concentration improving.      Headache days per month: 1 Headache free days per month: 29  Current Headache Regimen: Preventative: Amitriptyline 25 mg nightly Abortive: Maxalt 10 mg PRN   Prior Therapies                                  Maxalt 10 mg PRN Amitriptyline 25 mg nightly    Physical Exam:   Vital Signs: BP 111/70   Pulse 99   Ht 5\' 2"  (1.575 m)   Wt 148 lb (67.1 kg)   BMI 27.07 kg/m  GENERAL:  well appearing, in no acute distress, alert  SKIN:  Color, texture, turgor normal. No rashes or lesions HEAD:  Normocephalic/atraumatic. RESP: normal respiratory effort MSK:  No gross joint deformities.   NEUROLOGICAL: Mental Status: Alert, oriented to person, place and time, Follows commands, and Speech fluent and appropriate. Cranial Nerves: PERRL, face symmetric, no dysarthria, hearing grossly intact Motor: moves all extremities equally Gait: normal-based.    IMPRESSION: 22 year old nonbinary patient who presents for follow up of headaches. They report worsening concentration issues and headaches following a head trauma in August. [facility] 03/2022 unremarkable.  Headaches improved on amitriptyline currently experiencing about  1 migraine day per month.  Concentration and focusing difficulties also gradually improving.  Use of Maxalt with benefit.    PLAN:  -Prevention: Continue amitriptyline 25 mg nightly -Rescue: Continue Maxalt 10 mg PRN   Follow-up: 7 months    I spent 16 minutes of face-to-face and non-face-to-face time with patient.  This included previsit chart review, lab review, study review, order entry, electronic health record documentation, patient education and discussion regarding the above and answered all the questions to patient's satisfaction  Frann Rider, [facility]  [facility] [address] [place], [place] [address]  Phone [phone] Fax [phone] Note: This document was prepared with digital dictation and possible smart phrase technology. Any transcriptional errors that result from this process are unintentional.

## 2022-07-02 ENCOUNTER — Other Ambulatory Visit: Payer: Self-pay

## 2022-07-02 ENCOUNTER — Emergency Department (HOSPITAL_COMMUNITY)
Admission: EM | Admit: 2022-07-02 | Discharge: 2022-07-03 | Disposition: A | Discharge status: Home / Self Care | Attending: Emergency Medicine | Admitting: Emergency Medicine

## 2022-07-02 DIAGNOSIS — J45909 Unspecified asthma, uncomplicated: Secondary | ICD-10-CM | POA: Insufficient documentation

## 2022-07-02 DIAGNOSIS — G43109 Migraine with aura, not intractable, without status migrainosus: Secondary | ICD-10-CM | POA: Diagnosis not present

## 2022-07-02 DIAGNOSIS — R519 Headache, unspecified: Secondary | ICD-10-CM | POA: Diagnosis present

## 2022-07-02 DIAGNOSIS — Z7951 Long term (current) use of inhaled steroids: Secondary | ICD-10-CM | POA: Insufficient documentation

## 2022-07-02 MED ORDER — DEXAMETHASONE SODIUM PHOSPHATE 10 MG/ML IJ SOLN
10.0000 mg | Freq: Once | INTRAMUSCULAR | Status: AC
  Administered 2022-07-03: 10 mg via INTRAVENOUS
  Filled 2022-07-02: qty 1

## 2022-07-02 MED ORDER — SODIUM CHLORIDE 0.9 % IV BOLUS
1000.0000 mL | Freq: Once | INTRAVENOUS | Status: AC
  Administered 2022-07-02: 1000 mL via INTRAVENOUS

## 2022-07-02 MED ORDER — KETOROLAC TROMETHAMINE 15 MG/ML IJ SOLN
15.0000 mg | Freq: Once | INTRAMUSCULAR | Status: AC
  Administered 2022-07-03: 15 mg via INTRAVENOUS
  Filled 2022-07-02: qty 1

## 2022-07-02 MED ORDER — PROCHLORPERAZINE EDISYLATE 10 MG/2ML IJ SOLN
10.0000 mg | Freq: Once | INTRAMUSCULAR | Status: AC
  Administered 2022-07-03: 10 mg via INTRAVENOUS
  Filled 2022-07-02: qty 2

## 2022-07-02 NOTE — ED Triage Notes (Signed)
She felt a migraine coming on about 6:30 so she took her rescue meds. About 7 pm the migraine got worse speech is a little delayed and coworkers noticed her stumbling, ems cleared her for stroke but pt was hypotennsive for them.

## 2022-07-03 ENCOUNTER — Emergency Department (HOSPITAL_BASED_OUTPATIENT_CLINIC_OR_DEPARTMENT_OTHER)
Admission: EM | Admit: 2022-07-03 | Discharge: 2022-07-03 | Disposition: A | Discharge status: Home / Self Care | Source: Home / Self Care | Attending: Emergency Medicine | Admitting: Emergency Medicine

## 2022-07-03 ENCOUNTER — Encounter (HOSPITAL_COMMUNITY): Payer: Self-pay

## 2022-07-03 ENCOUNTER — Encounter (HOSPITAL_BASED_OUTPATIENT_CLINIC_OR_DEPARTMENT_OTHER): Payer: Self-pay

## 2022-07-03 ENCOUNTER — Other Ambulatory Visit: Payer: Self-pay

## 2022-07-03 DIAGNOSIS — G43809 Other migraine, not intractable, without status migrainosus: Secondary | ICD-10-CM | POA: Insufficient documentation

## 2022-07-03 MED ORDER — MAGNESIUM SULFATE IN D5W 1-5 GM/100ML-% IV SOLN
1.0000 g | Freq: Once | INTRAVENOUS | Status: AC
  Administered 2022-07-03: 1 g via INTRAVENOUS
  Filled 2022-07-03: qty 100

## 2022-07-03 MED ORDER — LACTATED RINGERS IV BOLUS
1000.0000 mL | Freq: Once | INTRAVENOUS | Status: AC
  Administered 2022-07-03: 1000 mL via INTRAVENOUS

## 2022-07-03 MED ORDER — DIPHENHYDRAMINE HCL 50 MG/ML IJ SOLN
25.0000 mg | INTRAMUSCULAR | Status: AC
  Administered 2022-07-03: 25 mg via INTRAVENOUS
  Filled 2022-07-03: qty 1

## 2022-07-03 MED ORDER — METOCLOPRAMIDE HCL 5 MG/ML IJ SOLN
10.0000 mg | INTRAMUSCULAR | Status: AC
  Administered 2022-07-03: 10 mg via INTRAVENOUS
  Filled 2022-07-03: qty 2

## 2022-07-03 MED ORDER — METOCLOPRAMIDE HCL 10 MG PO TABS: 10.0000 mg | ORAL_TABLET | Freq: Four times a day (QID) | ORAL | 0 refills | Status: DC

## 2022-07-03 NOTE — Discharge Instructions (Signed)
You were seen for your headache in the emergency department.   At home, please take Tylenol, ibuprofen, and the Reglan we have given you for your headache.  You can also take the Reglan with Benadryl that please note that may make you tired.    Check your MyChart online for the results of any tests that had not resulted by the time you left the emergency department.   Follow-up with your primary doctor in 2-3 days regarding your visit.    Return immediately to the emergency department if you experience any of the following: Weakness of your arms or legs, vision changes, or any other concerning symptoms.    Thank you for visiting our Emergency Department. It was a pleasure taking care of you today.

## 2022-07-03 NOTE — ED Notes (Signed)
Pt feeling much better. Pt verbalized understanding of d/c instructions, meds, and followup care. Denies questions. VSS, no distress noted. Pt states she has ride home. Steady gait to exit with all belongings.

## 2022-07-03 NOTE — ED Triage Notes (Addendum)
Patient here POV from Home.  Endorses Migraine that began Yesterday. Seen and treated Last PM at another ED and felt somewhat better at the time of DC but today the Migraine continued to worsen and it became difficult to focus in class and began to cause speech difficulty which occurs typically with her Migraines.   Took Migraine Medication at [facility] without relief. Gait and Speech has improved since being in ED yesterday.   No N/V. No Fever.   NAD Noted during Triage. A&Ox4. GCS 15. Ambulatory.

## 2022-07-03 NOTE — ED Provider Notes (Signed)
[facility] Provider Note   CSN: [id number] Arrival date & time: 07/03/22  1412     History {Add pertinent medical, surgical, social history, OB history to HPI:1} Chief Complaint  Patient presents with   Migraine    Mindy Woodward is a 22 y.o. adult.  22 year old nonbinary individual with a history of migraines who presents emergency department with headache and restlessness.  Patient started developing a headache last night.  Describes it as throbbing and retro-orbital on their left side.  Built up gradually.  Last night thinks she may have had some difficulty walking but it resolved.  Did take amitriptyline and rizatriptan without significant improvement so went to [facility] earlier today and she had a reassuring neurologic exam and was discharged home.  After going home reports that the headache persisted and she was feeling very anxious so came into the emergency department again for evaluation.  Denies any fevers, neck stiffness, vision changes, numbness or weakness of her arms or legs.  Has had MRI in April and in November had a CT scan of her head.       Home Medications Prior to Admission medications   Medication Sig Start Date End Date Taking? Authorizing Provider  albuterol (VENTOLIN HFA) 108 (90 Base) MCG/ACT inhaler Inhale 2 puffs into the lungs every 4 (four) hours as needed. 07/12/21   [provider]  amitriptyline (ELAVIL) 25 MG tablet Take 1 tablet (25 mg total) by mouth at bedtime. 06/03/22   Frann Rider, NP  etonogestrel (NEXPLANON) 68 MG IMPL implant 1 each by Subdermal route once.    [provider]  famotidine (PEPCID) 20 MG tablet Take 20 mg by mouth daily. 11/30/21   [provider]  fluticasone-salmeterol [facility] INHUB) 100-50 MCG/ACT AEPB INHALE 1 PUFF INTO THE LUNGS TWICE DAILY 04/29/22   Martyn Ehrich, NP  rizatriptan (MAXALT) 10 MG tablet Take 1 tablet (10 mg total) by mouth as  needed for migraine. May repeat in 2 hours if needed 08/28/21   Genia Harold, MD      Allergies    Penicillins    Review of Systems   Review of Systems  Physical Exam Updated Vital Signs BP 127/74 (BP Location: Right Arm)   Pulse (!) 120   Temp 98.3 F (36.8 C) (Temporal)   Resp 18   Ht 5' 2"$  (1.575 m)   Wt 77.5 kg   LMP 05/18/2022   SpO2 100%   BMI 31.25 kg/m  Physical Exam Vitals and nursing note reviewed.  Constitutional:      General: Mindy Woodward is not in acute distress.    Appearance: Mindy Woodward is well-developed.  HENT:     Head: Normocephalic and atraumatic.     Right Ear: External ear normal.     Left Ear: External ear normal.     Nose: Nose normal.  Eyes:     Extraocular Movements: Extraocular movements intact.     Conjunctiva/sclera: Conjunctivae normal.     Pupils: Pupils are equal, round, and reactive to light.  Neck:     Comments: No meningismus Musculoskeletal:     Cervical back: Normal range of motion and neck supple.  Skin:    General: Skin is warm and dry.  Neurological:     Mental Status: Mindy Woodward is alert. Mental status is at baseline.     Comments: MENTAL STATUS: AAOx3 CRANIAL NERVES: II: Pupils equal and reactive *** mm BL, no  RAPD, no VF deficits III, IV, VI: EOM intact, no gaze preference or deviation, no nystagmus. V: normal sensation to light touch in V1, V2, and V3 segments bilaterally VII: no facial weakness or asymmetry, no nasolabial fold flattening VIII: normal hearing to speech and finger friction IX, X: normal palatal elevation, no uvular deviation XI: 5/5 head turn and 5/5 shoulder shrug bilaterally XII: midline tongue protrusion MOTOR: 5/5 strength in R shoulder flexion, elbow flexion and extension, and grip strength. 5/5 strength in L shoulder flexion, elbow flexion and extension, and grip strength.  5/5 strength in R hip and knee flexion, knee extension, ankle plantar and dorsiflexion. 5/5 strength in L hip  and knee flexion, knee extension, ankle plantar and dorsiflexion. SENSORY: Normal sensation to light touch in all extremities COORD: Normal finger to nose and heel to shin, no tremor, no dysmetria STATION: normal stance, no truncal ataxia GAIT: Normal  Psychiatric:        Mood and Affect: Mood normal.        Behavior: Behavior normal.     ED Results / Procedures / Treatments   Labs (all labs ordered are listed, but only abnormal results are displayed) Labs Reviewed - No data to display  EKG None  Radiology No results found.  Procedures Procedures  {Document cardiac monitor, telemetry assessment procedure when appropriate:1}  Medications Ordered in ED Medications  metoCLOPramide (REGLAN) injection 10 mg (has no administration in time range)  diphenhydrAMINE (BENADRYL) injection 25 mg (has no administration in time range)  lactated ringers bolus 1,000 mL (has no administration in time range)  magnesium sulfate IVPB 1 g 100 mL (has no administration in time range)    ED Course/ Medical Decision Making/ A&P   {   Click here for ABCD2, HEART and other calculatorsREFRESH Note before signing :1}                          Medical Decision Making Risk Prescription drug management.   ***Shared decision making about CT  {Document critical care time when appropriate:1} {Document review of labs and clinical decision tools ie heart score, Chads2Vasc2 etc:1}  {Document your independent review of radiology images, and any outside records:1} {Document your discussion with family members, caretakers, and with consultants:1} {Document social determinants of health affecting pt's care:1} {Document your decision making why or why not admission, treatments were needed:1} Final Clinical Impression(s) / ED Diagnoses Final diagnoses:  None    Rx / DC Orders ED Discharge Orders     None

## 2022-07-03 NOTE — ED Notes (Signed)
Patient ambulatory to bathroom and back to stretcher.

## 2022-07-03 NOTE — ED Notes (Signed)
Patient given discharge instructions and follow up care. Patient verbalized understanding. IV removed with catheter intact. Patient ambulatory out of ED.

## 2022-07-03 NOTE — ED Provider Notes (Addendum)
[place] EMERGENCY DEPARTMENT AT [facility] Provider Note  CSN: [id number] Arrival date & time: 07/02/22 2244  Chief Complaint(s) Migraine  HPI Mindy Woodward is a 22 y.o. adult     Migraine This is a recurrent problem. The current episode started 6 to 12 hours ago. The problem occurs constantly. Progression since onset: fluctuating. Associated symptoms include headaches. Pertinent negatives include no chest pain, no abdominal pain and no shortness of breath. Nothing aggravates the symptoms. Nothing relieves the symptoms. Treatments tried: Maxalt. The treatment provided no relief.   Had speech difficulty and ataxia.  Past Medical History Past Medical History:  Diagnosis Date   Asthma    Headache    Photophobia    Patient Active Problem List   Diagnosis Date Noted   Frequent headaches 04/07/2022   Mild intermittent asthma 04/02/2022   Home Medication(s) Prior to Admission medications   Medication Sig Start Date End Date Taking? Authorizing Provider  albuterol (VENTOLIN HFA) 108 (90 Base) MCG/ACT inhaler Inhale 2 puffs into the lungs every 4 (four) hours as needed. 07/12/21   [provider]  amitriptyline (ELAVIL) 25 MG tablet Take 1 tablet (25 mg total) by mouth at bedtime. 06/03/22   Frann Rider, NP  etonogestrel (NEXPLANON) 68 MG IMPL implant 1 each by Subdermal route once.    [provider]  famotidine (PEPCID) 20 MG tablet Take 20 mg by mouth daily. 11/30/21   [provider]  fluticasone-salmeterol [facility] INHUB) 100-50 MCG/ACT AEPB INHALE 1 PUFF INTO THE LUNGS TWICE DAILY 04/29/22   Martyn Ehrich, NP  rizatriptan (MAXALT) 10 MG tablet Take 1 tablet (10 mg total) by mouth as needed for migraine. May repeat in 2 hours if needed 08/28/21   Genia Harold, MD                                                                                                                                    Allergies Penicillins  Review of  Systems Review of Systems  Respiratory:  Negative for shortness of breath.   Cardiovascular:  Negative for chest pain.  Gastrointestinal:  Negative for abdominal pain.  Neurological:  Positive for headaches.   As noted in HPI  Physical Exam Vital Signs  I have reviewed the triage vital signs BP 109/78   Pulse 109  Temp 98.4 F (36.9 C) (Oral)   Resp 20   Ht 5' 2"$  (1.575 m)   Wt 77.5 kg   LMP 05/18/2022   SpO2 97%   BMI 31.25 kg/m   Physical Exam Vitals reviewed.  Constitutional:      General: Brent I. Kerrigan is not in acute distress.    Appearance: Audrie I. Devlin is well-developed. Dalaya I. Lawe is not diaphoretic.  HENT:     Head: Normocephalic and atraumatic.     Right Ear: External ear normal.     Left Ear: External ear normal.  Nose: Nose normal.     Mouth/Throat:     Mouth: Mucous membranes are moist.  Eyes:     General: No scleral icterus.    Conjunctiva/sclera: Conjunctivae normal.  Neck:     Trachea: Phonation normal.  Cardiovascular:     Rate and Rhythm: Normal rate and regular rhythm.  Pulmonary:     Effort: Pulmonary effort is normal. No respiratory distress.     Breath sounds: No stridor.  Abdominal:     General: There is no distension.  Musculoskeletal:        General: Normal range of motion.     Cervical back: Normal range of motion.  Neurological:     Mental Status: Rafaela I. Rheault is alert and oriented to person, place, and time.     Comments: Mental Status:  Alert and oriented to person, place, and time.  Attention and concentration normal.  Speech clear, but stuttered.  Recent memory is intact  Cranial Nerves:  II Visual Fields: Intact to confrontation. Visual fields intact. III, IV, VI: Pupils equal and reactive to light and near. Full eye movement without nystagmus  V Facial Sensation: Normal. No weakness of masticatory muscles  VII: No facial weakness or asymmetry  VIII Auditory Acuity: Grossly normal  IX/X: The uvula is midline;  the palate elevates symmetrically  XI: Normal sternocleidomastoid and trapezius strength  XII: The tongue is midline. No atrophy or fasciculations.   Motor System: Muscle Strength: 5/5 and symmetric in the upper and lower extremities. No pronation or drift.  Muscle Tone: Tone and muscle bulk are normal in the upper and lower extremities.  Reflexes: DTRs: 1+ and symmetrical in all four extremities. No Clonus Coordination: Intact finger-to-nose. No tremor.  Sensation: Intact to light touch.   Gait: Routine gait normal. (After migraine cocktail)   Psychiatric:        Behavior: Behavior normal.     ED Results and Treatments Labs (all labs ordered are listed, but only abnormal results are displayed) Labs Reviewed - No data to display                                                                                                                       EKG  EKG Interpretation  Date/Time:  Tuesday July 02 2022 23:06:17 EST Ventricular Rate:  93 PR Interval:  131 QRS Duration: 89 QT Interval:  366 QTC Calculation: 456 R Axis:   71 Text Interpretation: Sinus rhythm Borderline Q waves in inferior leads Borderline T wave abnormalities No significant change was found Confirmed by Addison Lank [phone]) on 07/03/2022 1:17:20 AM       Radiology No results found.  Medications Ordered in ED Medications  prochlorperazine (COMPAZINE) injection 10 mg (10 mg Intravenous Given 07/03/22 0000)  dexamethasone (DECADRON) injection 10 mg (10 mg Intravenous Given 07/03/22 0000)  sodium chloride 0.9 % bolus 1,000 mL (0 mLs Intravenous Stopped 07/03/22 0220)  ketorolac (TORADOL) 15 MG/ML injection 15 mg (15 mg Intravenous Given 07/03/22 0000)  Procedures Procedures  (including critical care time)  Medical Decision Making / ED Course   Medical Decision  Making Risk Prescription drug management.    Typical migraine headache for the pt. Non focal neuro exam. No recent head trauma. No fever. Doubt meningitis. Doubt intracranial bleed. Doubt IIH. No indication for imaging. Will treat with migraine cocktail and reevaluate.  Pain resolved. Speech and gait improved.      Final Clinical Impression(s) / ED Diagnoses Final diagnoses:  Complicated migraine   The patient appears reasonably screened and/or stabilized for discharge and I doubt any other medical condition or other [facility] requiring further screening, evaluation, or treatment in the ED at this time. I have discussed the findings, Dx and Tx plan with the patient/family who expressed understanding and agree(s) with the plan. Discharge instructions discussed at length. The patient/family was given strict return precautions who verbalized understanding of the instructions. No further questions at time of discharge.  Disposition: Discharge  Condition: Good  ED Discharge Orders     None        Follow Up: Gertha Calkin, PA-C [place]  [id number] [phone]  Call  to schedule an appointment for close follow up           This chart was dictated using voice recognition software.  Despite best efforts to proofread,  errors can occur which can change the documentation meaning.    Fatima Blank, MD 07/03/22 [phone]

## 2022-07-31 ENCOUNTER — Ambulatory Visit: Admitting: Student

## 2022-07-31 ENCOUNTER — Encounter: Payer: Self-pay | Admitting: Student

## 2022-07-31 VITALS — BP 112/76 | HR 99 | Ht 62.0 in | Wt 164.4 lb

## 2022-07-31 DIAGNOSIS — J4530 Mild persistent asthma, uncomplicated: Secondary | ICD-10-CM

## 2022-07-31 NOTE — Patient Instructions (Addendum)
-   can use advair as needed: If you find you're developing chest tightness again and needing to use albuterol more frequently than 1-2x per week, then start using wixela 1-2 puffs twice daily for 3-4 days until that feeling has subsided then can stop.  - albuterol can continue up to 4 times daily as needed as a rescue inhaler as well

## 2022-07-31 NOTE — Progress Notes (Signed)
Synopsis: Referred for dyspnea by Light, Alyssa B, PA-C  Subjective:   PATIENT ID: Mindy Woodward GENDER: nonbinary DOB: 01-27-2001, MRN: [id number]  Chief Complaint  Patient presents with   Follow-up   22y with no PMH asthma, eczema  Winded walking around campus with band-like sensation around chest since 04/2021. She has used albuterol which she thinks sometimes helps. Has never had course of prednisone for this. Has not tried any other inhalers. She has sinus congestion sometimes at night, occasional postnasal drainage.     She has no family history of lung disease  She is a Ship broker at [employer]. She has smoked MJ once. No vaping. She has 2 dogs.   Interval HPI  Taking wixela 1 puff twice daily. Using albuterol 1-2 x weekly. No prednisone since last visit.   Otherwise pertinent review of systems is negative. Past Medical History:  Diagnosis Date   Asthma    Headache    Photophobia      No family history on file.   No past surgical history on file.  Social History   Socioeconomic History   Marital status: Single    Spouse name: Not on file   Number of children: 0   Years of education: Not on file   Highest education level: Not on file  Occupational History    Comment: student [employer]  Tobacco Use   Smoking status: Never    Passive exposure: Never   Smokeless tobacco: Never  Vaping Use   Vaping Use: Never used  Substance and Sexual Activity   Alcohol use: Never   Drug use: Never   Sexual activity: Not on file  Other Topics Concern   Not on file  Social History Narrative   Not on file   Social Determinants of Health   Financial Resource Strain: Not on file  Food Insecurity: Not on file  Transportation Needs: Not on file  Physical Activity: Not on file  Stress: Not on file  Social Connections: Not on file  Intimate Partner Violence: Not on file     Allergies  Allergen Reactions   Penicillins     Family member passed from taking PCN      Outpatient Medications Prior to Visit  Medication Sig Dispense Refill   albuterol (VENTOLIN HFA) 108 (90 Base) MCG/ACT inhaler Inhale 2 puffs into the lungs every 4 (four) hours as needed.     amitriptyline (ELAVIL) 25 MG tablet Take 1 tablet (25 mg total) by mouth at bedtime. 90 tablet 3   etonogestrel (NEXPLANON) 68 MG IMPL implant 1 each by Subdermal route once.     famotidine (PEPCID) 20 MG tablet Take 20 mg by mouth daily.     fluticasone-salmeterol (WIXELA INHUB) 100-50 MCG/ACT AEPB INHALE 1 PUFF INTO THE LUNGS TWICE DAILY 180 each 3   metoCLOPramide (REGLAN) 10 MG tablet Take 1 tablet (10 mg total) by mouth every 6 (six) hours. 10 tablet 0   rizatriptan (MAXALT) 10 MG tablet Take 1 tablet (10 mg total) by mouth as needed for migraine. May repeat in 2 hours if needed 10 tablet 3   No facility-administered medications prior to visit.       Objective:   Physical Exam:  General appearance: 22 y.o., adult, NAD, conversant, adult, NAD, conversant  Eyes: anicteric sclerae; PERRL, tracking appropriately HENT: NCAT; MMM Neck: Trachea midline; no lymphadenopathy, no JVD Lungs: CTAB, no crackles, no wheeze, with normal respiratory effort CV: RRR, no murmur  Abdomen: Soft, non-tender; non-distended, BS present  Extremities: No peripheral edema, warm Skin: Normal turgor and texture; no rash Psych: Appropriate affect Neuro: Alert and oriented to person and place, no focal deficit     Vitals:   07/31/22 1517  BP: 112/76  Pulse: 99  SpO2: 97%  Weight: 164 lb 6.4 oz (74.6 kg)  Height: '5\' 2"'$  (1.575 m)    97% on RA BMI Readings from Last 3 Encounters:  07/31/22 30.07 kg/m  07/03/22 31.25 kg/m  07/02/22 31.25 kg/m   Wt Readings from Last 3 Encounters:  07/31/22 164 lb 6.4 oz (74.6 kg)  07/03/22 170 lb 13.7 oz (77.5 kg)  07/02/22 170 lb 13.7 oz (77.5 kg)     CBC    Component Value Date/Time   WBC 8.0 09/17/2021 1804   RBC 5.12 (H) 09/17/2021 1804   HGB 15.4 (H) 09/17/2021 1804   HCT  44.6 09/17/2021 1804   PLT 339 09/17/2021 1804   MCV 87.1 09/17/2021 1804   MCH 30.1 09/17/2021 1804   MCHC 34.5 09/17/2021 1804   RDW 12.3 09/17/2021 1804   LYMPHSABS 1.9 08/10/2021 1829   MONOABS 0.5 08/10/2021 1829   EOSABS 0.0 08/10/2021 1829   BASOSABS 0.1 08/10/2021 1829    Chest Imaging: CXR 09/17/21 reviewed by me unremarkable  Pulmonary Functions Testing Results:    Latest Ref Rng & Units 04/02/2022    1:49 PM  PFT Results  FVC-Pre L 3.10   FVC-Predicted Pre % 87   FVC-Post L 2.85   FVC-Predicted Post % 80   Pre FEV1/FVC % % 85   Post FEV1/FCV % % 85   FEV1-Pre L 2.63   FEV1-Predicted Pre % 84   FEV1-Post L 2.44   DLCO uncorrected ml/min/mmHg 25.53   DLCO UNC% % 124   DLCO corrected ml/min/mmHg 25.53   DLCO COR %Predicted % 124   DLVA Predicted % 133   TLC L 4.15   TLC % Predicted % 87   RV % Predicted % 102       Assessment & Plan:   # DOE # Chest tightness # Possible mild persistent asthma Partial relief with albuterol, uncontrolled asthma certainly possible. Good air movement without wheeze today.  # Inspiratory flow-volume loop flattening  Plan: - can use advair as needed: If you find you're developing chest tightness again and needing to use albuterol more frequently than 1-2x per week, then start using wixela 1-2 puffs twice daily for 3-4 days until that feeling has subsided then can stop.  - albuterol can continue up to 4 times daily as needed as a rescue inhaler as well - declines ENT referral now for FNL exam but can discuss again at later visit if not under good control   RTC 3 months  Maryjane Hurter, MD [facility] 07/31/2022 4:19 PM

## 2023-01-06 ENCOUNTER — Ambulatory Visit: Admitting: Adult Health

## 2023-01-06 ENCOUNTER — Encounter: Payer: Self-pay | Admitting: Adult Health

## 2023-01-06 NOTE — Progress Notes (Deleted)
   CC:  headaches No chief complaint on file.   Follow-up Visit  Last visit: 06/03/2022  Brief HPI: 22 year old nonbinary patient with a history of asthma who follows in clinic for migraines. MRI brain and CTA head/neck in April 2023 were unremarkable. CTH completed for persistent posttraumatic headaches which was unremarkable.  At prior visit, recommended continuation of amitriptyline reporting 1 migraine per month and continued use of Maxalt for rescue.  Started on amitriptyline for headaches and insomnia and continued Maxalt for rescue.     Interval History:  Reports improvement of migraines since prior visit being amitriptyline, having about 1 migraine per month. Used maxalt which helped.  Amitriptyline did help sleep as well. Focus and concentration improving.      Headache days per month: 1 Headache free days per month: 29  Current Headache Regimen: Preventative: Amitriptyline 25 mg nightly Abortive: Maxalt 10 mg PRN   Prior Therapies                                  Maxalt 10 mg PRN Amitriptyline 25 mg nightly    Physical Exam:   Vital Signs: There were no vitals taken for this visit. GENERAL:  well appearing, in no acute distress, alert  SKIN:  Color, texture, turgor normal. No rashes or lesions HEAD:  Normocephalic/atraumatic. RESP: normal respiratory effort MSK:  No gross joint deformities.   NEUROLOGICAL: Mental Status: Alert, oriented to person, place and time, Follows commands, and Speech fluent and appropriate. Cranial Nerves: PERRL, face symmetric, no dysarthria, hearing grossly intact Motor: moves all extremities equally Gait: normal-based.    IMPRESSION: 22 year old nonbinary patient who presents for follow up of headaches. They report worsening concentration issues and headaches following a head trauma in August. [facility] 03/2022 unremarkable.  Headaches improved on amitriptyline currently experiencing about 1 migraine day per month.  Concentration  and focusing difficulties also gradually improving.  Use of Maxalt with benefit.    PLAN:  -Prevention: Continue amitriptyline 25 mg nightly -Rescue: Continue Maxalt 10 mg PRN   Follow-up: 7 months    I spent 16 minutes of face-to-face and non-face-to-face time with patient.  This included previsit chart review, lab review, study review, order entry, electronic health record documentation, patient education and discussion regarding the above and answered all the questions to patient's satisfaction  Ihor Austin, [facility]  [facility] [address] [place], [place] [address]  Phone [phone] Fax [phone] Note: This document was prepared with digital dictation and possible smart phrase technology. Any transcriptional errors that result from this process are unintentional.

## 2023-01-13 DIAGNOSIS — Z0289 Encounter for other administrative examinations: Secondary | ICD-10-CM

## 2023-01-28 ENCOUNTER — Encounter: Payer: Self-pay | Admitting: Adult Health

## 2023-01-28 ENCOUNTER — Ambulatory Visit: Admitting: Adult Health

## 2023-01-28 VITALS — BP 109/68 | HR 87 | Ht 62.0 in | Wt 167.0 lb

## 2023-01-28 DIAGNOSIS — G43009 Migraine without aura, not intractable, without status migrainosus: Secondary | ICD-10-CM | POA: Diagnosis not present

## 2023-01-28 NOTE — Progress Notes (Signed)
CC:  headaches Chief Complaint  Patient presents with   Follow-up    Patient in room #3 and alone. Patient states they still have some migraines but not many since the last visit.    Follow-up Visit  Last visit: 06/03/2022  Brief HPI: 22 year old nonbinary patient with a history of asthma who follows in clinic for migraines. MRI brain and CTA head/neck in April 2023 were unremarkable. CTH completed for persistent posttraumatic headaches which was unremarkable.  At prior visit, recommended continuation of amitriptyline 25mg  nightly reporting 1 migraine per month and continued use of Maxalt for rescue.      Interval History:  Reports continued benefit of amitriptyline, currently having 2-3 migraines per month, at times can have less. Use of rizatriptan with benefit.    Headache days per month: 2-3   Current Headache Regimen: Preventative: Amitriptyline 25 mg nightly Abortive: Maxalt 10 mg PRN   Prior Therapies                                  Maxalt 10 mg PRN Amitriptyline 25 mg nightly  Current Outpatient Medications on File Prior to Visit  Medication Sig Dispense Refill   albuterol (VENTOLIN HFA) 108 (90 Base) MCG/ACT inhaler Inhale 2 puffs into the lungs every 4 (four) hours as needed.     amitriptyline (ELAVIL) 25 MG tablet Take 1 tablet (25 mg total) by mouth at bedtime. 90 tablet 3   amphetamine-dextroamphetamine (ADDERALL XR) 10 MG 24 hr capsule Take 10 mg by mouth every morning.     amphetamine-dextroamphetamine (ADDERALL) 10 MG tablet Take 10 mg by mouth daily with breakfast.     etonogestrel (NEXPLANON) 68 MG IMPL implant 1 each by Subdermal route once.     fluticasone-salmeterol (WIXELA INHUB) 100-50 MCG/ACT AEPB INHALE 1 PUFF INTO THE LUNGS TWICE DAILY 180 each 3   hydrOXYzine (ATARAX) 25 MG tablet Take 25 mg by mouth daily.     rizatriptan (MAXALT) 10 MG tablet Take 1 tablet (10 mg total) by mouth as needed for migraine. May repeat in 2 hours if needed 10  tablet 3   No current facility-administered medications on file prior to visit.   Past Medical History:  Diagnosis Date   Asthma    Headache    Photophobia       Physical Exam:   Vital Signs: BP 109/68 (BP Location: Left Arm, Patient Position: Sitting, Cuff Size: Normal)   Pulse 87   Ht 5\' 2"  (1.575 m)   Wt 167 lb (75.8 kg)   BMI 30.54 kg/m  GENERAL:  well appearing, in no acute distress, alert  SKIN:  Color, texture, turgor normal. No rashes or lesions HEAD:  Normocephalic/atraumatic. RESP: normal respiratory effort MSK:  No gross joint deformities.   NEUROLOGICAL: Mental Status: Alert, oriented to person, place and time, Follows commands, and Speech fluent and appropriate. Cranial Nerves: PERRL, face symmetric, no dysarthria, hearing grossly intact Motor: moves all extremities equally Gait: normal-based.    IMPRESSION: 22 year old nonbinary patient who presents for follow up of headaches. They report worsening concentration issues and headaches following a head trauma in 12/2021. [facility] 03/2022 unremarkable.  Headaches improved on amitriptyline currently experiencing about 2-3 migraine days per month. Use of Maxalt with benefit.    PLAN:  -Prevention: Continue amitriptyline 25 mg nightly -Rescue: Continue Maxalt 10 mg PRN -advised to call with any worsening headaches   Follow  up in 1 year or call earlier if needed    I spent 16 minutes of face-to-face and non-face-to-face time with patient.  This included previsit chart review, lab review, study review, order entry, electronic health record documentation, patient education and discussion regarding the above and answered all the questions to patient's satisfaction  Ihor Austin, [facility]  [facility] [address] [place], [place] [address]  Phone [phone] Fax [phone] Note: This document was prepared with digital dictation and possible smart phrase technology. Any  transcriptional errors that result from this process are unintentional.

## 2023-01-28 NOTE — Patient Instructions (Addendum)
Your Plan:  Continue amitriptyline 25mg  for migraine prevention   Continue rizatriptan for migraine rescue  If migraines start to worsen please give me a call      Follow-up in 1 year or call earlier if needed      Thank you for coming to see [place] at [facility]. I hope we have been able to provide you high quality care today.  You may receive a patient satisfaction survey over the next few weeks. We would appreciate your feedback and comments so that we may continue to improve ourselves and the health of our patients.

## 2023-04-10 ENCOUNTER — Other Ambulatory Visit: Payer: Self-pay

## 2023-04-10 ENCOUNTER — Emergency Department (HOSPITAL_COMMUNITY)
Admission: EM | Admit: 2023-04-10 | Discharge: 2023-04-11 | Disposition: A | Discharge status: Home / Self Care | Attending: Emergency Medicine | Admitting: Emergency Medicine

## 2023-04-10 ENCOUNTER — Encounter (HOSPITAL_COMMUNITY): Payer: Self-pay

## 2023-04-10 DIAGNOSIS — Z7951 Long term (current) use of inhaled steroids: Secondary | ICD-10-CM | POA: Insufficient documentation

## 2023-04-10 DIAGNOSIS — J45909 Unspecified asthma, uncomplicated: Secondary | ICD-10-CM | POA: Insufficient documentation

## 2023-04-10 DIAGNOSIS — R1013 Epigastric pain: Secondary | ICD-10-CM | POA: Insufficient documentation

## 2023-04-10 DIAGNOSIS — R112 Nausea with vomiting, unspecified: Secondary | ICD-10-CM | POA: Insufficient documentation

## 2023-04-10 LAB — CBC WITH DIFFERENTIAL/PLATELET
Abs Immature Granulocytes: 0.01 10*3/uL (ref 0.00–0.07)
Basophils Absolute: 0.1 10*3/uL (ref 0.0–0.1)
Basophils Relative: 1 %
Eosinophils Absolute: 0.1 10*3/uL (ref 0.0–0.5)
Eosinophils Relative: 1 %
HCT: 39.7 % (ref 36.0–46.0)
Hemoglobin: 14 g/dL (ref 12.0–15.0)
Immature Granulocytes: 0 %
Lymphocytes Relative: 29 %
Lymphs Abs: 2.4 10*3/uL (ref 0.7–4.0)
MCH: 30.4 pg (ref 26.0–34.0)
MCHC: 35.3 g/dL (ref 30.0–36.0)
MCV: 86.1 fL (ref 80.0–100.0)
Monocytes Absolute: 0.7 10*3/uL (ref 0.1–1.0)
Monocytes Relative: 8 %
Neutro Abs: 5.2 10*3/uL (ref 1.7–7.7)
Neutrophils Relative %: 61 %
Platelets: 316 10*3/uL (ref 150–400)
RBC: 4.61 MIL/uL (ref 3.87–5.11)
RDW: 12.9 % (ref 11.5–15.5)
WBC: 8.5 10*3/uL (ref 4.0–10.5)
nRBC: 0 % (ref 0.0–0.2)

## 2023-04-10 LAB — COMPREHENSIVE METABOLIC PANEL
ALT: 22 U/L (ref 0–44)
AST: 22 U/L (ref 15–41)
Albumin: 4.7 g/dL (ref 3.5–5.0)
Alkaline Phosphatase: 59 U/L (ref 38–126)
Anion gap: 12 (ref 5–15)
BUN: 16 mg/dL (ref 6–20)
CO2: 18 mmol/L — ABNORMAL LOW (ref 22–32)
Calcium: 9.6 mg/dL (ref 8.9–10.3)
Chloride: 106 mmol/L (ref 98–111)
Creatinine, Ser: 0.82 mg/dL (ref 0.44–1.00)
GFR, Estimated: >60 mL/min (ref >60)
Glucose, Bld: 87 mg/dL (ref 70–99)
Potassium: 3.5 mmol/L (ref 3.5–5.1)
Sodium: 136 mmol/L (ref 135–145)
Total Bilirubin: 0.8 mg/dL (ref <1.2)
Total Protein: 8.2 g/dL — ABNORMAL HIGH (ref 6.5–8.1)

## 2023-04-10 LAB — LIPASE, BLOOD: Lipase: 30 U/L (ref 11–51)

## 2023-04-10 MED ORDER — DICYCLOMINE HCL 10 MG PO CAPS
10.0000 mg | ORAL_CAPSULE | Freq: Once | ORAL | Status: AC
  Administered 2023-04-10: 10 mg via ORAL
  Filled 2023-04-10: qty 1

## 2023-04-10 MED ORDER — ALUM & MAG HYDROXIDE-SIMETH 200-200-20 MG/5ML PO SUSP
30.0000 mL | Freq: Once | ORAL | Status: AC
  Administered 2023-04-10: 30 mL via ORAL
  Filled 2023-04-10: qty 30

## 2023-04-10 MED ORDER — LIDOCAINE VISCOUS HCL 2 % MT SOLN
15.0000 mL | Freq: Once | OROMUCOSAL | Status: AC
  Administered 2023-04-10: 15 mL via ORAL
  Filled 2023-04-10: qty 15

## 2023-04-10 NOTE — ED Provider Notes (Signed)
[place] EMERGENCY DEPARTMENT AT [facility] Provider Note   CSN: [id number] Arrival date & time: 04/10/23  2148     History  Chief Complaint  Patient presents with   Abdominal Pain    Mindy Woodward is a 22 y.o. adult.  22 year old female with a history of asthma presents to the emergency department for evaluation of abdominal pain.  She has been experiencing epigastric pain for the past 2 days which waxes and wanes in severity.  Has tried Motrin for pain w/o relief.  Symptoms associated with nausea and vomiting.  She reports being seen at urgent care 2 days ago where she had an abdominal x-ray completed.  Was told that she was constipated and has been taking a laxative, but still has not had a bowel movement.  She has been passing flatus.  No prior abdominal surgeries or associated fevers, urinary symptoms.  Denies sick contacts.  The history is provided by the patient. No language interpreter was used.  Abdominal Pain      Home Medications Prior to Admission medications   Medication Sig Start Date End Date Taking? Authorizing Provider  dicyclomine (BENTYL) 20 MG tablet Take 1 tablet (20 mg total) by mouth every 12 (twelve) hours as needed (for abdominal pain/cramping). 04/11/23  Yes Antony Madura, PA-C  omeprazole (PRILOSEC) 20 MG capsule Take 1 capsule (20 mg total) by mouth daily. 04/11/23  Yes Antony Madura, PA-C  sucralfate (CARAFATE) 1 g tablet Take 1 tablet (1 g total) by mouth 4 (four) times daily -  with meals and at bedtime. 04/11/23  Yes Antony Madura, PA-C  albuterol (VENTOLIN HFA) 108 (90 Base) MCG/ACT inhaler Inhale 2 puffs into the lungs every 4 (four) hours as needed. 07/12/21   [provider]  amitriptyline (ELAVIL) 25 MG tablet Take 1 tablet (25 mg total) by mouth at bedtime. 06/03/22   Ihor Austin, NP  amphetamine-dextroamphetamine (ADDERALL XR) 10 MG 24 hr capsule Take 10 mg by mouth every morning.    [provider]   amphetamine-dextroamphetamine (ADDERALL) 10 MG tablet Take 10 mg by mouth daily with breakfast. 01/07/23   [provider]  etonogestrel (NEXPLANON) 68 MG IMPL implant 1 each by Subdermal route once.    [provider]  fluticasone-salmeterol [facility] INHUB) 100-50 MCG/ACT AEPB INHALE 1 PUFF INTO THE LUNGS TWICE DAILY 04/29/22   Glenford Bayley, NP  hydrOXYzine (ATARAX) 25 MG tablet Take 25 mg by mouth daily.    [provider]  rizatriptan (MAXALT) 10 MG tablet Take 1 tablet (10 mg total) by mouth as needed for migraine. May repeat in 2 hours if needed 08/28/21   Ocie Doyne, MD      Allergies    Penicillin g and Penicillins    Review of Systems   Review of Systems  Gastrointestinal:  Positive for abdominal pain.  Ten systems reviewed and are negative for acute change, except as noted in the HPI.    Physical Exam Updated Vital Signs BP (!) 122/94 (BP Location: Left Arm)   Pulse (!) 109   Temp 98.4 F (36.9 C) (Oral)   Resp 18   Ht 5\' 2"  (1.575 m)   Wt 73.5 kg   SpO2 100%   BMI 29.63 kg/m  Physical Exam Vitals and nursing note reviewed.  Constitutional:      General: Jazzlene is not in acute distress.    Appearance: Rainna is well-developed. Kenika is not diaphoretic.     Comments: Nontoxic appearing and  in NAD  HENT:     Head: Normocephalic and atraumatic.  Eyes:     General: No scleral icterus.    Extraocular Movements: EOM normal.     Conjunctiva/sclera: Conjunctivae normal.  Pulmonary:     Effort: Pulmonary effort is normal. No respiratory distress.  Abdominal:     Tenderness: There is abdominal tenderness.     Comments: Mild epigastric TTP. No guarding, masses, peritoneal signs.  Musculoskeletal:        General: Normal range of motion.     Cervical back: Normal range of motion.  Skin:    General: Skin is warm and dry.     Coloration: Skin is not pale.     Findings: No erythema or rash.  Neurological:     Mental Status: Arryanna is  alert and oriented to person, place, and time.  Psychiatric:        Mood and Affect: Mood and affect normal.        Behavior: Behavior normal.     ED Results / Procedures / Treatments   Labs (all labs ordered are listed, but only abnormal results are displayed) Labs Reviewed  COMPREHENSIVE METABOLIC PANEL - Abnormal; Notable for the following components:      Result Value   CO2 18 (*)    Total Protein 8.2 (*)    All other components within normal limits  URINALYSIS, ROUTINE W REFLEX MICROSCOPIC - Abnormal; Notable for the following components:   Color, Urine STRAW (*)    Ketones, ur 20 (*)    All other components within normal limits  CBC WITH DIFFERENTIAL/PLATELET  LIPASE, BLOOD  HCG, SERUM, QUALITATIVE    EKG None  Radiology No results found.  Procedures Procedures    Medications Ordered in ED Medications  dicyclomine (BENTYL) capsule 10 mg (10 mg Oral Given 04/10/23 2323)  alum & mag hydroxide-simeth (MAALOX/MYLANTA) 200-200-20 MG/5ML suspension 30 mL (30 mLs Oral Given 04/10/23 2341)    And  lidocaine (XYLOCAINE) 2 % viscous mouth solution 15 mL (15 mLs Oral Given 04/10/23 2341)    ED Course/ Medical Decision Making/ A&P                                 Medical Decision Making Amount and/or Complexity of Data Reviewed Labs: ordered.  Risk OTC drugs. Prescription drug management.   This patient presents to the ED for concern of abdominal pain w/nausea vomiting, this involves an extensive number of treatment options, and is a complaint that carries with it a high risk of complications and morbidity.  The differential diagnosis includes ileus vs pSBO vs SBO vs gastritis/PUD vs viral illness vs IBS vs intraabdominal infection   Co morbidities that complicate the patient evaluation  ADHD   Additional history obtained:  External records from outside source obtained and reviewed including abdominal Xray done at [facility] on 04/08/23   Lab Tests:  I  Ordered, and personally interpreted labs.  The pertinent results include:  CO2 18. Labs otherwise reassuring, noncontributory. Pregnancy negative.   Cardiac Monitoring:  The patient was maintained on a cardiac monitor.  I personally viewed and interpreted the cardiac monitored which showed an underlying rhythm of: sinus tachycardia   Medicines ordered and prescription drug management:  I ordered medication including GI cocktail and Bentyl for pain  Reevaluation of the patient after these medicines showed that the patient improved I have reviewed the patients home medicines and have  made adjustments as needed   Test Considered:  CT abdomen/pelvis - felt low yield given symptom chronicity, reassuring labs. No peritoneal signs on exam.   Problem List / ED Course:  22 year old female presenting for ongoing abdominal pain with nausea and vomiting.  She has a fairly benign abdominal exam with tenderness only in the epigastrium.  She has had significant symptomatic improvement following a GI cocktail and Bentyl.  I suspect that she may have a degree of gastritis contributing to her pain.  She does report taking NSAIDs for pain control which may have only aggravated her discomfort. Was seen at an outside urgent care 2 days ago and had an x-ray of her abdomen completed.  This was benign.  No obstructive bowel gas pattern, free air.  Her laboratory evaluation in the emergency department is reassuring.  Specifically, no leukocytosis to suggest infectious etiology.  She does not have criteria for SIRS/sepsis.  No notable electrolyte derangement.  Liver and kidney function preserved.  Her pregnancy test is negative and urinalysis is not suggestive of UTI. Stable improvement on repeat examination.  Believe the patient is stable for follow-up further with her primary doctor.  Will provide a prescription for omeprazole as well as Carafate.  Given short course of Bentyl to continue for pain control as  needed.   Reevaluation:  After the interventions noted above, I reevaluated the patient and found that they have :improved   Social Determinants of Health:  Lives independently.   Dispostion:  After consideration of the diagnostic results and the patients response to treatment, I feel that the patent would benefit from f/u with her PCP. Given medications for support of pain and nausea. Return precautions discussed and provided. Patient discharged in stable condition with no unaddressed concerns.          Final Clinical Impression(s) / ED Diagnoses Final diagnoses:  Epigastric pain    Rx / DC Orders ED Discharge Orders          Ordered    sucralfate (CARAFATE) 1 g tablet  3 times daily with meals & bedtime        04/11/23 0212    dicyclomine (BENTYL) 20 MG tablet  Every 12 hours PRN        04/11/23 0212    omeprazole (PRILOSEC) 20 MG capsule  Daily        04/11/23 0212              Antony Madura, PA-C 04/11/23 0250    Long, Arlyss Repress, MD 04/12/23 [phone]

## 2023-04-10 NOTE — ED Triage Notes (Signed)
C/o generalized abd pain with n/v x2 days.  Patient reports going to UC 2 days ago and diagnosed with constipation.  Unk last bowel movement.  Pt reports passing gas.

## 2023-04-11 LAB — URINALYSIS, ROUTINE W REFLEX MICROSCOPIC
Bilirubin Urine: NEGATIVE
Glucose, UA: NEGATIVE mg/dL
Hgb urine dipstick: NEGATIVE
Ketones, ur: 20 mg/dL — AB
Leukocytes,Ua: NEGATIVE
Nitrite: NEGATIVE
Protein, ur: NEGATIVE mg/dL
Specific Gravity, Urine: 1.008 (ref 1.005–1.030)
pH: 7 (ref 5.0–8.0)

## 2023-04-11 LAB — HCG, SERUM, QUALITATIVE: Preg, Serum: NEGATIVE

## 2023-04-11 MED ORDER — OMEPRAZOLE 20 MG PO CPDR: 20.0000 mg | DELAYED_RELEASE_CAPSULE | Freq: Every day | ORAL | 0 refills | Status: AC

## 2023-04-11 MED ORDER — SUCRALFATE 1 G PO TABS: 1.0000 g | ORAL_TABLET | Freq: Three times a day (TID) | ORAL | 0 refills | Status: AC

## 2023-04-11 MED ORDER — DICYCLOMINE HCL 20 MG PO TABS: 20.0000 mg | ORAL_TABLET | Freq: Two times a day (BID) | ORAL | 0 refills | Status: AC | PRN

## 2023-04-11 NOTE — Discharge Instructions (Signed)
Your evaluation in the emergency department was reassuring.  We recommend daily use of Prilosec and Carafate as prescribed.  If you continue to have abdominal pain or cramping, use Bentyl as prescribed.  Try to limit use of aspirin, ibuprofen or Aleve.  Follow-up with a primary care doctor to ensure resolution of symptoms.

## 2023-04-29 ENCOUNTER — Other Ambulatory Visit: Payer: Self-pay | Admitting: Primary Care

## 2023-04-29 ENCOUNTER — Telehealth: Payer: Self-pay | Admitting: Adult Health

## 2023-04-29 MED ORDER — FLUTICASONE-SALMETEROL 100-50 MCG/ACT IN AEPB: 1.0000 | INHALATION_SPRAY | Freq: Two times a day (BID) | RESPIRATORY_TRACT | 2 refills | Status: DC

## 2023-04-29 NOTE — Telephone Encounter (Signed)
Need Wixela refill sent to pharmacy .  Advised to make ov for follow up

## 2023-06-02 ENCOUNTER — Emergency Department (HOSPITAL_COMMUNITY)

## 2023-06-02 ENCOUNTER — Encounter (HOSPITAL_COMMUNITY): Payer: Self-pay

## 2023-06-02 ENCOUNTER — Emergency Department (HOSPITAL_COMMUNITY): Admission: EM | Admit: 2023-06-02 | Discharge: 2023-06-02 | Disposition: A | Discharge status: Home / Self Care

## 2023-06-02 ENCOUNTER — Other Ambulatory Visit: Payer: Self-pay

## 2023-06-02 DIAGNOSIS — Z7951 Long term (current) use of inhaled steroids: Secondary | ICD-10-CM | POA: Diagnosis not present

## 2023-06-02 DIAGNOSIS — S93401A Sprain of unspecified ligament of right ankle, initial encounter: Secondary | ICD-10-CM | POA: Diagnosis not present

## 2023-06-02 DIAGNOSIS — Y9301 Activity, walking, marching and hiking: Secondary | ICD-10-CM | POA: Diagnosis not present

## 2023-06-02 DIAGNOSIS — J45909 Unspecified asthma, uncomplicated: Secondary | ICD-10-CM | POA: Diagnosis not present

## 2023-06-02 DIAGNOSIS — X501XXA Overexertion from prolonged static or awkward postures, initial encounter: Secondary | ICD-10-CM | POA: Diagnosis not present

## 2023-06-02 DIAGNOSIS — M25571 Pain in right ankle and joints of right foot: Secondary | ICD-10-CM | POA: Diagnosis present

## 2023-06-02 MED ORDER — IBUPROFEN 200 MG PO TABS
600.0000 mg | ORAL_TABLET | Freq: Once | ORAL | Status: AC
  Administered 2023-06-02: 600 mg via ORAL
  Filled 2023-06-02: qty 3

## 2023-06-02 NOTE — ED Provider Notes (Signed)
 [place] EMERGENCY DEPARTMENT AT [facility] Provider Note   CSN: [id number] Arrival date & time: 06/02/23  1809     History  Chief Complaint  Patient presents with   Ankle Pain    Mindy Woodward is a 23 y.o. adult with medical history of asthma, headaches and photophobia.  The patient presents to the ED for evaluation of right ankle injury.  Reports that she was running to class earlier when she had an acute onset of pain in her right ankle.  She reports a history of spraining her right ankle as well as her left ankle.  She denies falling, any kind of mechanical issue that led to her ankle pain.  Reports that she sometimes will have ankle pain.  Reports she is unable to apply weight to her right ankle.  Denies any medications prior to arrival.  Denies fevers, redness or right ankle.   Ankle Pain      Home Medications Prior to Admission medications   Medication Sig Start Date End Date Taking? Authorizing Provider  albuterol  (VENTOLIN  HFA) 108 (90 Base) MCG/ACT inhaler Inhale 2 puffs into the lungs every 4 (four) hours as needed. 07/12/21   [provider]  amitriptyline  (ELAVIL ) 25 MG tablet Take 1 tablet (25 mg total) by mouth at bedtime. 06/03/22   Whitfield Raisin, NP  amphetamine-dextroamphetamine (ADDERALL XR) 10 MG 24 hr capsule Take 10 mg by mouth every morning.    [provider]  amphetamine-dextroamphetamine (ADDERALL) 10 MG tablet Take 10 mg by mouth daily with breakfast. 01/07/23   [provider]  dicyclomine  (BENTYL ) 20 MG tablet Take 1 tablet (20 mg total) by mouth every 12 (twelve) hours as needed (for abdominal pain/cramping). 04/11/23   Keith Sor, PA-C  etonogestrel (NEXPLANON) 68 MG IMPL implant 1 each by Subdermal route once.    [provider]  fluticasone -salmeterol (WIXELA INHUB) 100-50 MCG/ACT AEPB Inhale 1 puff into the lungs 2 (two) times daily. 04/29/23   Parrett, Madelin RAMAN, NP  hydrOXYzine (ATARAX) 25 MG tablet  Take 25 mg by mouth daily.    [provider]  omeprazole  (PRILOSEC) 20 MG capsule Take 1 capsule (20 mg total) by mouth daily. 04/11/23   Keith Sor, PA-C  rizatriptan  (MAXALT ) 10 MG tablet Take 1 tablet (10 mg total) by mouth as needed for migraine. May repeat in 2 hours if needed 08/28/21   Rush Nest, MD  sucralfate  (CARAFATE ) 1 g tablet Take 1 tablet (1 g total) by mouth 4 (four) times daily -  with meals and at bedtime. 04/11/23   Keith Sor, PA-C      Allergies    Penicillin g and Penicillins    Review of Systems   Review of Systems  Musculoskeletal:  Positive for arthralgias.  All other systems reviewed and are negative.   Physical Exam Updated Vital Signs BP 117/75 (BP Location: Right Arm)   Pulse (!) 109   Temp 98.2 F (36.8 C) (Oral)   Resp 16   Ht 5' 2 (1.575 m)   Wt 73 kg   SpO2 99%   BMI 29.44 kg/m  Physical Exam Vitals and nursing note reviewed.  Constitutional:      General: Mindy Woodward is not in acute distress.    Appearance: Normal appearance. Miyoshi is not ill-appearing, toxic-appearing or diaphoretic.  HENT:     Head: Normocephalic and atraumatic.     Mouth/Throat:     Mouth: Mucous membranes are moist.     Pharynx:  Oropharynx is clear.  Eyes:     Extraocular Movements: Extraocular movements intact.     Conjunctiva/sclera: Conjunctivae normal.     Pupils: Pupils are equal, round, and reactive to light.  Cardiovascular:     Rate and Rhythm: Normal rate and regular rhythm.  Pulmonary:     Effort: Pulmonary effort is normal.     Breath sounds: Normal breath sounds. No wheezing.  Abdominal:     General: Abdomen is flat. Bowel sounds are normal.     Palpations: Abdomen is soft.     Tenderness: There is no abdominal tenderness.  Musculoskeletal:     Cervical back: Normal range of motion and neck supple. No tenderness.     Comments: 2+ DP pulse on the right foot.  Brisk cap refill.  Patient has reduced range of motion secondary to pain.   Allows me to passively range her ankle fully.  Tenderness to lateral ankle.  Skin:    General: Skin is warm and dry.     Capillary Refill: Capillary refill takes less than 2 seconds.  Neurological:     Mental Status: Mindy Woodward is alert and oriented to person, place, and time.     ED Results / Procedures / Treatments   Labs (all labs ordered are listed, but only abnormal results are displayed) Labs Reviewed - No data to display  EKG None  Radiology DG Ankle Complete Right Result Date: 06/02/2023 CLINICAL DATA:  Fall.  Lateral ankle pain. EXAM: RIGHT ANKLE - COMPLETE 3+ VIEW COMPARISON:  Right ankle radiographs dated February 07, 2022. FINDINGS: No acute fracture or malalignment. Ankle mortise is congruent. Joint spaces are maintained. Mild soft tissue swelling at the lateral ankle. IMPRESSION: No acute osseous abnormality. Electronically Signed   By: Harrietta Sherry M.D.   On: 06/02/2023 20:01    Procedures Procedures   Medications Ordered in ED Medications  ibuprofen  (ADVIL ) tablet 600 mg (600 mg Oral Given 06/02/23 2234)    ED Course/ Medical Decision Making/ A&P  Medical Decision Making Amount and/or Complexity of Data Reviewed Radiology: ordered.  Risk OTC drugs.   23 year old individual presents for evaluation.  Please see HPI for further details.  On examination patient right ankle has no obvious deformity.  She allows me to passively range her right ankle.  She has reduced range of motion actively secondary to pain.  2+ DP pulse into the right foot.  Brisk capillary refill.  X-ray imaging negative for any kind of acute fracture.  Patient will be placed in a cam boot and referred to orthopedics.  Was provided 600 mg of ibuprofen  for pain.  Counseled on RICE therapy at home.  Voiced understanding.  Stable to discharge home.   Final Clinical Impression(s) / ED Diagnoses Final diagnoses:  Sprain of right ankle, unspecified ligament, initial encounter    Rx / DC  Orders ED Discharge Orders     None         Ruthell Lonni FALCON, PA-C 06/02/23 2237    Neysa Caron PARAS, DO 06/02/23 2307

## 2023-06-02 NOTE — ED Triage Notes (Signed)
 BIBA from school c/o right ankle pain after twisting it while walking.  Right pedal pulses noted.  Denies fall.

## 2023-06-02 NOTE — Discharge Instructions (Signed)
 It was a pleasure taking part in your care.  As we discussed, the x-ray we took of your ankle showed no kind of fracture.  You most likely have an ankle sprain.  Please utilize cam boot provided for walking.  Please follow-up with orthopedics.  Please call Dr. Fransisco office in the morning and make an appointment to be seen.  He may take ibuprofen  every 6 hours as needed for pain.  You may also ice your ankle.  Follow RICE therapy.  This stands for rest, ice, compression and elevation.  Please read attached guide.  Please return to ED with any new or worsening symptoms.

## 2023-06-12 ENCOUNTER — Ambulatory Visit: Admitting: Adult Health

## 2023-06-17 ENCOUNTER — Ambulatory Visit (INDEPENDENT_AMBULATORY_CARE_PROVIDER_SITE_OTHER): Admitting: Primary Care

## 2023-06-17 ENCOUNTER — Encounter: Payer: Self-pay | Admitting: Primary Care

## 2023-06-17 VITALS — BP 113/76 | HR 97 | Temp 97.8°F | Ht 62.0 in | Wt 159.4 lb

## 2023-06-17 DIAGNOSIS — J4530 Mild persistent asthma, uncomplicated: Secondary | ICD-10-CM

## 2023-06-17 MED ORDER — ALBUTEROL SULFATE HFA 108 (90 BASE) MCG/ACT IN AERS: 2.0000 | INHALATION_SPRAY | RESPIRATORY_TRACT | 1 refills | Status: AC | PRN

## 2023-06-17 MED ORDER — FLUTICASONE-SALMETEROL 100-50 MCG/ACT IN AEPB: 1.0000 | INHALATION_SPRAY | Freq: Two times a day (BID) | RESPIRATORY_TRACT | 11 refills | Status: DC

## 2023-06-17 NOTE — Progress Notes (Signed)
@Patient  ID: Mindy Woodward, adult    DOB: 02-09-2001, 23 y.o.   MRN: [id number]  No chief complaint on file.   Referring provider: Dickie La, PA-C  HPI: 23 year old, nonbinary.  Past medical history for mild intermittent asthma.  Former patient of Dr. Thora Lance.  Student at [employer]. Smoked MJ once. No vaping. Has 2 dogs.   06/17/2023 Discussed the use of AI scribe software for clinical note transcription with the patient, who gave verbal consent to proceed.  History of Present Illness   The patient presents for a follow-up for asthma management.   The patient initially began to experiencing symptoms such as exertional dyspnea and band-like chest tightness with activities like walking around campus back in 2022. They have been using albuterol as a rescue inhaler, which provides occasional relief. Since March, they have been consistently using Wixela, one puff twice daily, which has improved their symptoms during exercise but not during periods of acute illnesses.  Patient had Covid-19 in August, treated with paxlovid. In October, the patient experienced a respiratory infection, likely bronchitis, which led to increased use of albuterol, up to four times in one day. They managed this episode with over-the-counter medications without seeking additional medical care. They note that respiratory illnesses are less frequent when not attending college classes, likely due to reduced contact with others.  The patient lives with two dogs and does not smoke. They occasionally experience allergy symptoms such as nasal congestion and runny nose, without a consistent trigger.        Allergies  Allergen Reactions   Penicillin G Hives   Penicillins     Family member passed from taking PCN    Immunization History  Administered Date(s) Administered   Influenza-Unspecified 04/03/2022   PFIZER(Purple Top)SARS-COV-2 Vaccination 08/25/2019, 09/14/2019, 05/17/2020    Pfizer(Comirnaty)Fall Seasonal Vaccine 12 years and older 02/13/2023   Pneumococcal-Unspecified 04/26/2002, 07/01/2002, 02/22/2003, 08/26/2003   Tdap 02/08/2013, 01/10/2015    Past Medical History:  Diagnosis Date   Asthma    Headache    Photophobia     Tobacco History: Social History   Tobacco Use  Smoking Status Never   Passive exposure: Never  Smokeless Tobacco Never   Counseling given: Not Answered   Outpatient Medications Prior to Visit  Medication Sig Dispense Refill   albuterol (VENTOLIN HFA) 108 (90 Base) MCG/ACT inhaler Inhale 2 puffs into the lungs every 4 (four) hours as needed.     amitriptyline (ELAVIL) 25 MG tablet Take 1 tablet (25 mg total) by mouth at bedtime. 90 tablet 3   amphetamine-dextroamphetamine (ADDERALL XR) 10 MG 24 hr capsule Take 10 mg by mouth every morning.     amphetamine-dextroamphetamine (ADDERALL) 10 MG tablet Take 10 mg by mouth daily with breakfast.     dicyclomine (BENTYL) 20 MG tablet Take 1 tablet (20 mg total) by mouth every 12 (twelve) hours as needed (for abdominal pain/cramping). 20 tablet 0   etonogestrel (NEXPLANON) 68 MG IMPL implant 1 each by Subdermal route once.     fluticasone-salmeterol (WIXELA INHUB) 100-50 MCG/ACT AEPB Inhale 1 puff into the lungs 2 (two) times daily. 1 each 2   hydrOXYzine (ATARAX) 25 MG tablet Take 25 mg by mouth daily.     omeprazole (PRILOSEC) 20 MG capsule Take 1 capsule (20 mg total) by mouth daily. 30 capsule 0   rizatriptan (MAXALT) 10 MG tablet Take 1 tablet (10 mg total) by mouth as needed for migraine. May repeat in 2 hours if needed  10 tablet 3   sucralfate (CARAFATE) 1 g tablet Take 1 tablet (1 g total) by mouth 4 (four) times daily -  with meals and at bedtime. 120 tablet 0   No facility-administered medications prior to visit.   Review of Systems  Review of Systems  Constitutional: Negative.   HENT: Negative.    Respiratory: Negative.    Cardiovascular: Negative.    Physical  Exam  There were no vitals taken for this visit. Physical Exam Constitutional:      Appearance: Normal appearance.  HENT:     Head: Normocephalic and atraumatic.  Cardiovascular:     Rate and Rhythm: Normal rate and regular rhythm.  Pulmonary:     Effort: Pulmonary effort is normal.     Breath sounds: Normal breath sounds. No wheezing, rhonchi or rales.  Musculoskeletal:        General: Normal range of motion.  Skin:    General: Skin is warm and dry.  Neurological:     General: No focal deficit present.     Mental Status: Mindy Woodward is alert and oriented to person, place, and time. Mental status is at baseline.  Psychiatric:        Mood and Affect: Mood normal.        Behavior: Behavior normal.        Thought Content: Thought content normal.        Judgment: Judgment normal.      Lab Results:  CBC    Component Value Date/Time   WBC 8.5 04/10/2023 2317   RBC 4.61 04/10/2023 2317   HGB 14.0 04/10/2023 2317   HCT 39.7 04/10/2023 2317   PLT 316 04/10/2023 2317   MCV 86.1 04/10/2023 2317   MCH 30.4 04/10/2023 2317   MCHC 35.3 04/10/2023 2317   RDW 12.9 04/10/2023 2317   LYMPHSABS 2.4 04/10/2023 2317   MONOABS 0.7 04/10/2023 2317   EOSABS 0.1 04/10/2023 2317   BASOSABS 0.1 04/10/2023 2317    BMET    Component Value Date/Time   NA 136 04/10/2023 2317   K 3.5 04/10/2023 2317   CL 106 04/10/2023 2317   CO2 18 (L) 04/10/2023 2317   GLUCOSE 87 04/10/2023 2317   BUN 16 04/10/2023 2317   CREATININE 0.82 04/10/2023 2317   CALCIUM 9.6 04/10/2023 2317   GFRNONAA >60 04/10/2023 2317    BNP No results found for: "BNP"  ProBNP No results found for: "PROBNP"  Imaging: DG Ankle Complete Right Result Date: 06/02/2023 CLINICAL DATA:  Fall.  Lateral ankle pain. EXAM: RIGHT ANKLE - COMPLETE 3+ VIEW COMPARISON:  Right ankle radiographs dated February 07, 2022. FINDINGS: No acute fracture or malalignment. Ankle mortise is congruent. Joint spaces are maintained. Mild soft tissue  swelling at the lateral ankle. IMPRESSION: No acute osseous abnormality. Electronically Signed   By: Hart Robinsons M.D.   On: 06/02/2023 20:01     Assessment & Plan:   1. Mild persistent asthma without complication (Primary)     Mild Asthma Improved with additional of daily Wixela. Patient experiences exacerbation of asthma symptoms with respiratory infections, requiring increased use of rescue inhaler (Albuterol). No history of requiring systemic steroids (Prednisone). -Continue Wixela 100-10mcg 1 puff BID daily. -Use Albuterol as needed for acute symptoms. -Contact clinic if needing Albuterol more than 2-3 times a day during respiratory infection for potential short course of Prednisone or increased dose of inhaler. -Follow-up in 1 year or sooner if symptoms worsen.     Glenford Bayley, NP  06/17/2023  

## 2023-06-17 NOTE — Patient Instructions (Addendum)
 -  MILD ASTHMA: Mild asthma is a condition where the airways in your lungs can become inflamed and narrow, making it hard to breathe. Your symptoms have improved with the use of Wixela, but you still experience issues during respiratory infections. Continue using Wixela, one puff twice daily, and use Albuterol as needed for acute symptoms. If you find yourself needing Albuterol more than 2-3 times a day during a respiratory infection, please contact the clinic as you may need a short course of Prednisone or an increased dose of your inhaler. We will follow up in one year or sooner if your symptoms worsen.  INSTRUCTIONS:  Follow up in one year or sooner if symptoms worsen. Contact the clinic if you need to use Albuterol more than 2-3 times a day during a respiratory infection.  Follow-up 1 year with Waynetta Sandy NP

## 2023-07-17 ENCOUNTER — Telehealth: Payer: Self-pay | Admitting: Adult Health

## 2023-07-17 MED ORDER — AMITRIPTYLINE HCL 25 MG PO TABS: 25.0000 mg | ORAL_TABLET | Freq: Every day | ORAL | 1 refills | Status: AC

## 2023-07-17 NOTE — Telephone Encounter (Signed)
 Last seen on 01/28/23 Follow up scheduled on 01/28/24 Rx sent to requested pharmacy

## 2023-07-17 NOTE — Telephone Encounter (Signed)
 Pt request refill for amitriptyline (ELAVIL) 25 MG tablet send to [id number] -

## 2023-09-05 IMAGING — CR DG CHEST 2V
2 series · 2 of 2 positions shown · non-contrast
Comparison: none

CLINICAL DATA: Per EMS, patient from [REDACTED], c/o tachycardia,
worsening with standing and ambulating. States feels "tight band
around chest" that worsens with deep breathing - never a smoker - pt
states no other chest hx

EXAM:
CHEST - 2 VIEW

[w chest pa]
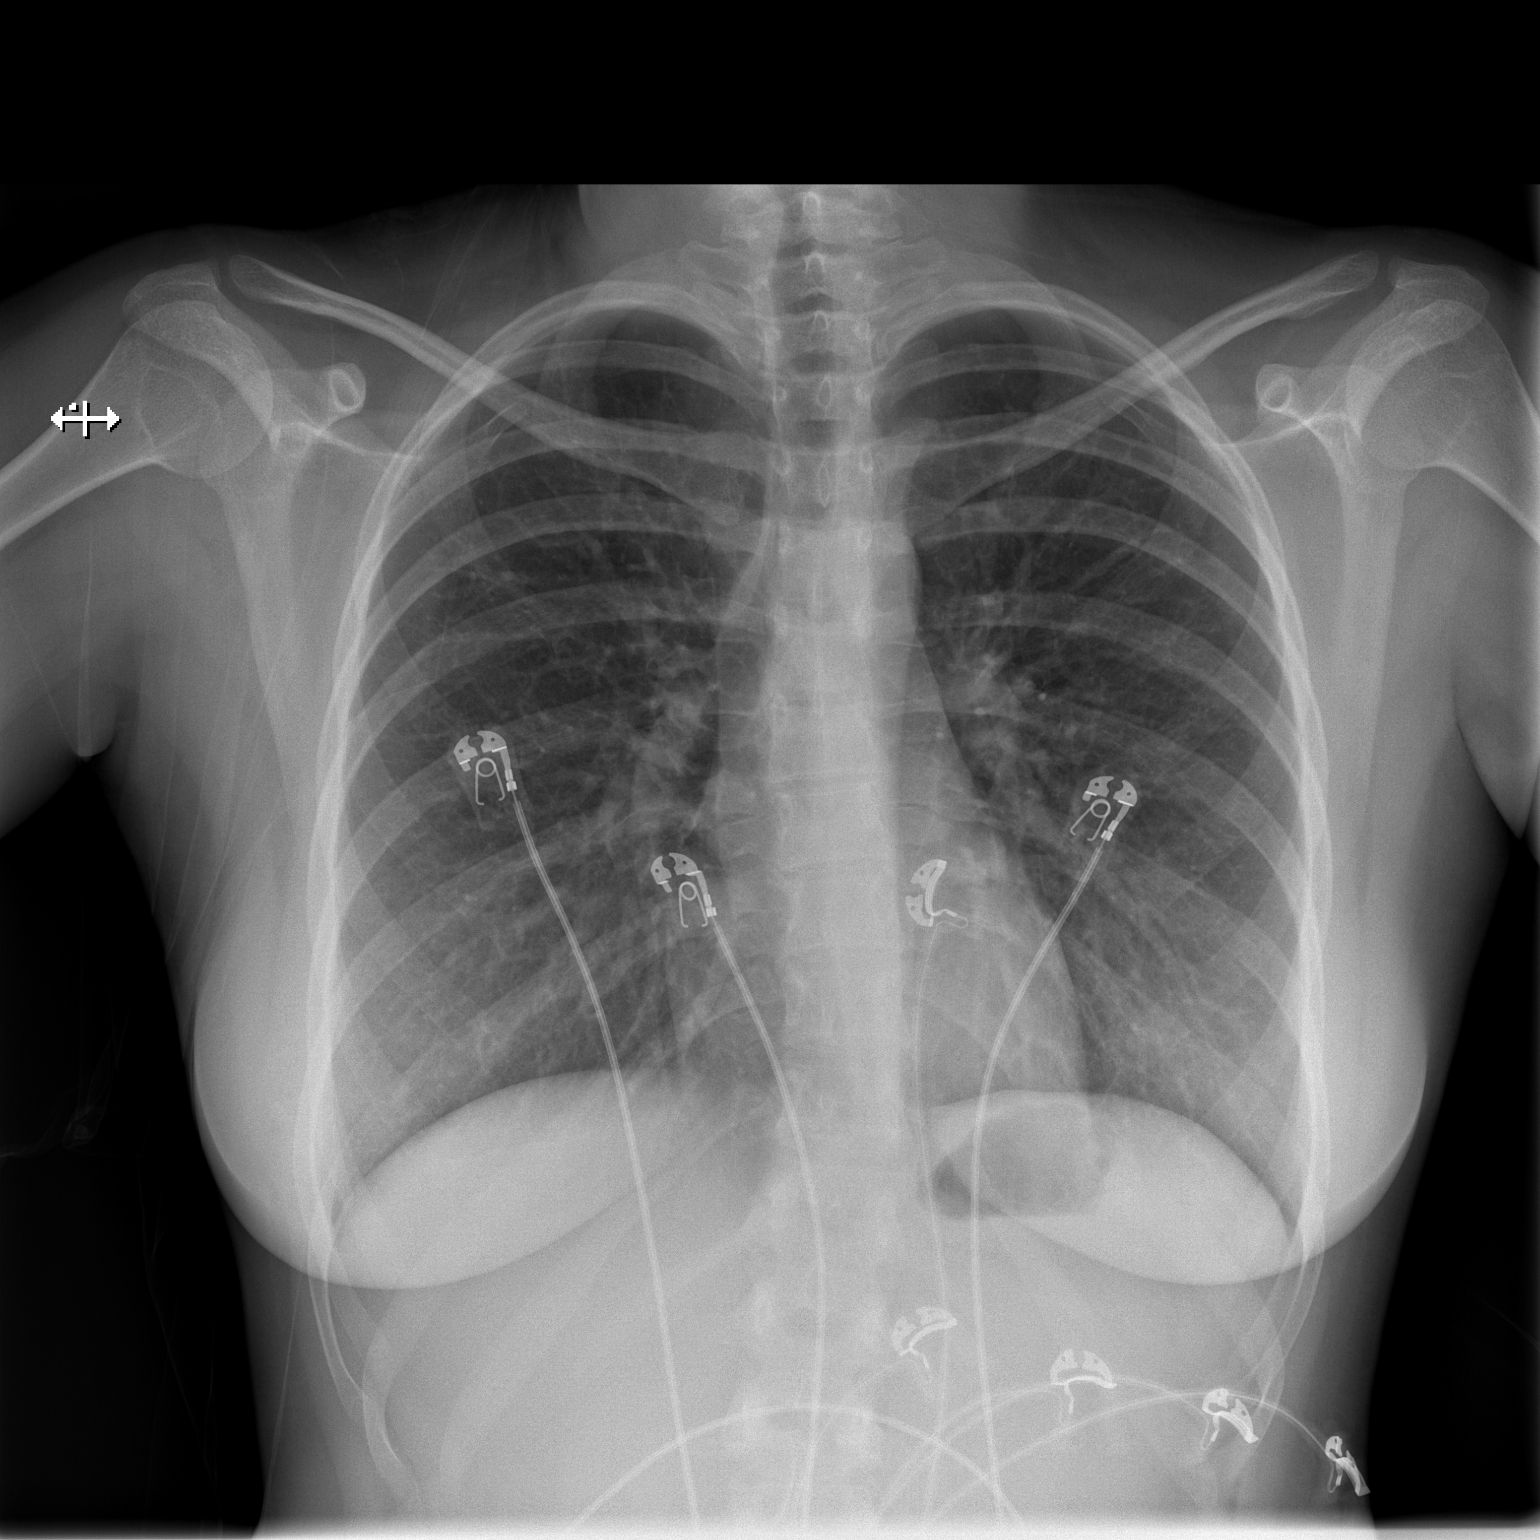

[w chest lat]
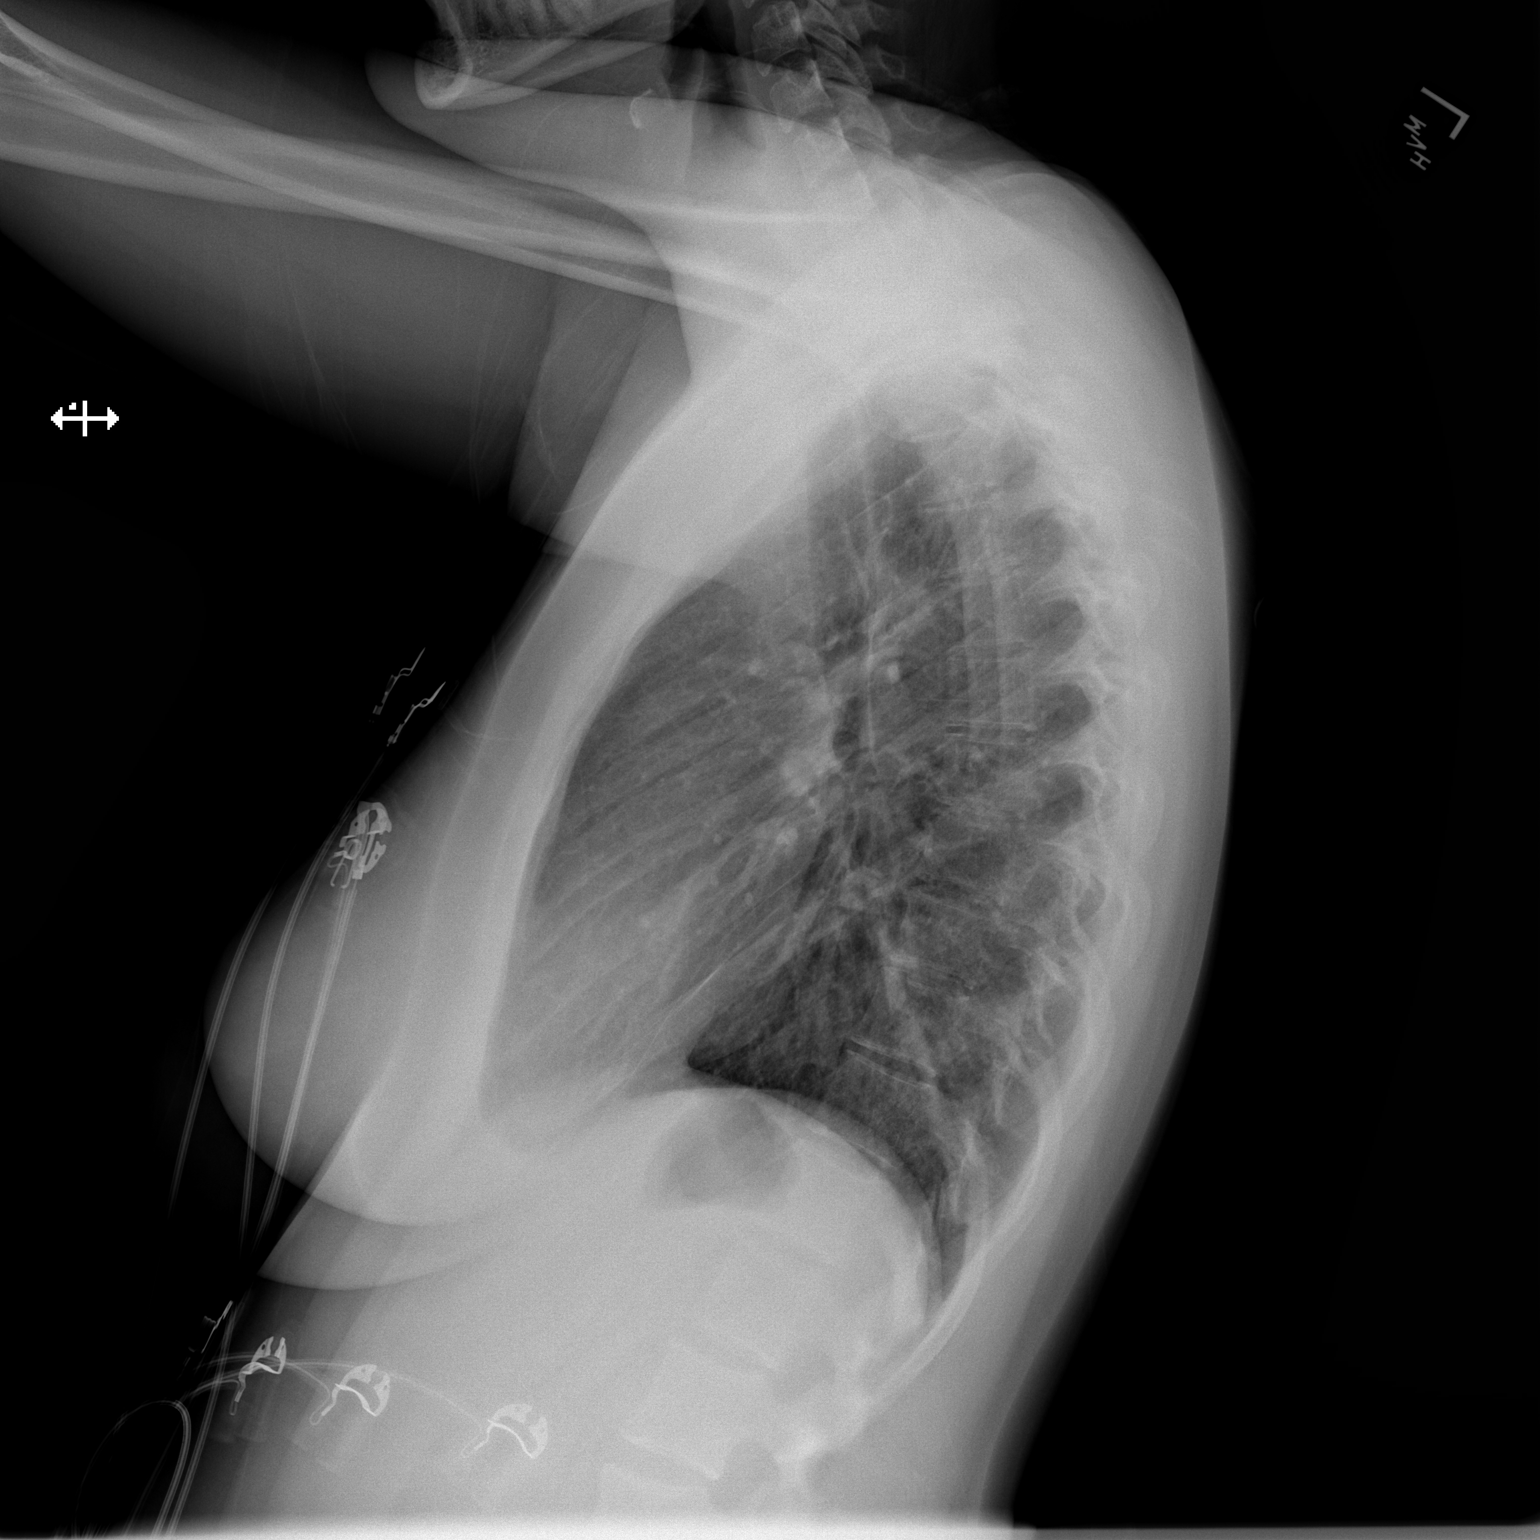

[2 of 2 positions shown; findings below may reference images not displayed]

FINDINGS: Lungs are clear.

Heart size and mediastinal contours are within normal limits.

No effusion.

Visualized bones unremarkable.
IMPRESSION: No acute cardiopulmonary disease.

## 2024-01-28 ENCOUNTER — Ambulatory Visit: Admitting: Adult Health

## 2024-02-12 NOTE — Progress Notes (Deleted)
 CC:  headaches No chief complaint on file.   Follow-up Visit  Last visit: 01/28/2023  Brief HPI: 23 year old nonbinary patient with a history of asthma who follows in clinic for migraines. MRI brain and CTA head/neck in April 2023 were unremarkable. CTH completed for persistent posttraumatic headaches which was unremarkable.  At prior visit, recommended continuation of amitriptyline  25mg  nightly reporting 2-3 migraines per month and continued use of Maxalt  for rescue.      Interval History:     Reports continued benefit of amitriptyline , currently having 2-3 migraines per month, at times can have less. Use of rizatriptan  with benefit.    Headache days per month: 2-3   Current Headache Regimen: Preventative: Amitriptyline  25 mg nightly Abortive: Maxalt  10 mg PRN   Prior Therapies                                  Maxalt  10 mg PRN Amitriptyline  25 mg nightly  Current Outpatient Medications on File Prior to Visit  Medication Sig Dispense Refill   albuterol  (VENTOLIN  HFA) 108 (90 Base) MCG/ACT inhaler Inhale 2 puffs into the lungs every 4 (four) hours as needed. 18 g 1   amitriptyline  (ELAVIL ) 25 MG tablet Take 1 tablet (25 mg total) by mouth at bedtime. 90 tablet 1   dicyclomine  (BENTYL ) 20 MG tablet Take 1 tablet (20 mg total) by mouth every 12 (twelve) hours as needed (for abdominal pain/cramping). 20 tablet 0   etonogestrel (NEXPLANON) 68 MG IMPL implant 1 each by Subdermal route once.     fluticasone -salmeterol (WIXELA INHUB) 100-50 MCG/ACT AEPB Inhale 1 puff into the lungs 2 (two) times daily. 1 each 11   hydrOXYzine (ATARAX) 25 MG tablet Take 25 mg by mouth daily.     omeprazole  (PRILOSEC) 20 MG capsule Take 1 capsule (20 mg total) by mouth daily. 30 capsule 0   rizatriptan  (MAXALT ) 10 MG tablet Take 1 tablet (10 mg total) by mouth as needed for migraine. May repeat in 2 hours if needed 10 tablet 3   sucralfate  (CARAFATE ) 1 g tablet Take 1 tablet (1 g total) by mouth  4 (four) times daily -  with meals and at bedtime. 120 tablet 0   No current facility-administered medications on file prior to visit.   Past Medical History:  Diagnosis Date   Asthma    Headache    Photophobia       Physical Exam:   Vital Signs: There were no vitals taken for this visit. GENERAL:  well appearing, in no acute distress, alert  SKIN:  Color, texture, turgor normal. No rashes or lesions HEAD:  Normocephalic/atraumatic. RESP: normal respiratory effort MSK:  No gross joint deformities.   NEUROLOGICAL: Mental Status: Alert, oriented to person, place and time, Follows commands, and Speech fluent and appropriate. Cranial Nerves: PERRL, face symmetric, no dysarthria, hearing grossly intact Motor: moves all extremities equally Gait: normal-based.    IMPRESSION: 23 year old nonbinary patient who presents for follow up of headaches. They report worsening concentration issues and headaches following a head trauma in 12/2021. [facility] 03/2022 unremarkable.  Headaches improved on amitriptyline  currently experiencing about 2-3 migraine days per month. Use of Maxalt  with benefit.    PLAN:  -Prevention: Continue amitriptyline  25 mg nightly -Rescue: Continue Maxalt  10 mg PRN -advised to call with any worsening headaches   Follow up in 1 year or call earlier if needed  I personally spent a total of *** minutes in the care of the patient today including {Time Based Coding:210964241}.   Harlene Bogaert, AGNP-BC  [facility] [address] [place], [place] [address]  Phone [phone] Fax [phone] Note: This document was prepared with digital dictation and possible smart phrase technology. Any transcriptional errors that result from this process are unintentional.

## 2024-02-16 ENCOUNTER — Encounter: Payer: Self-pay | Admitting: Adult Health

## 2024-02-16 ENCOUNTER — Telehealth: Payer: Self-pay | Admitting: Adult Health

## 2024-02-16 ENCOUNTER — Ambulatory Visit: Admitting: Adult Health

## 2024-02-16 NOTE — Telephone Encounter (Signed)
   Pt called to cancel appt today , Due to being sick appt Canceled   Appt Canceled

## 2024-02-24 ENCOUNTER — Ambulatory Visit: Admitting: Primary Care

## 2024-02-24 ENCOUNTER — Encounter: Payer: Self-pay | Admitting: Primary Care

## 2024-02-24 DIAGNOSIS — J4520 Mild intermittent asthma, uncomplicated: Secondary | ICD-10-CM

## 2024-03-01 ENCOUNTER — Other Ambulatory Visit: Payer: Self-pay

## 2024-03-01 MED ORDER — FLUTICASONE-SALMETEROL 100-50 MCG/ACT IN AEPB: 1.0000 | INHALATION_SPRAY | Freq: Two times a day (BID) | RESPIRATORY_TRACT | 2 refills | Status: DC

## 2024-03-01 NOTE — Progress Notes (Signed)
 Paper copy for [employer] received. We do not do paper RX so this was sent in electronically. NFN

## 2024-03-31 ENCOUNTER — Encounter: Payer: Self-pay | Admitting: Primary Care

## 2024-03-31 ENCOUNTER — Ambulatory Visit: Admitting: Primary Care

## 2024-03-31 VITALS — BP 130/72 | HR 103 | Temp 97.7°F | Ht 62.0 in | Wt 150.0 lb

## 2024-03-31 DIAGNOSIS — J4530 Mild persistent asthma, uncomplicated: Secondary | ICD-10-CM | POA: Diagnosis not present

## 2024-03-31 MED ORDER — FLUTICASONE-SALMETEROL 250-50 MCG/ACT IN AEPB: 1.0000 | INHALATION_SPRAY | Freq: Two times a day (BID) | RESPIRATORY_TRACT | 5 refills | Status: AC

## 2024-03-31 NOTE — Patient Instructions (Addendum)
  VISIT SUMMARY: You came in today for a follow-up on your asthma management. Your asthma symptoms have been affecting your daily activities intermittently over the past four weeks, with shortness of breath occurring three to six times a week. You have been using your rescue inhaler two to four times a week, which has been providing relief. You have not had any hospitalizations in the past six months. You are currently taking Advair twice a day and have recently started a new inhaler. You have also received your flu, COVID, and pneumonia vaccines on October 1st, 2025.  YOUR PLAN: -MILD PERSISTENT ASTHMA: Mild persistent asthma means you have asthma symptoms more than twice a week but not daily. Your symptoms include shortness of breath, wheezing, and chest tightness occurring three to six times a week. We have decided to increase the dose of your medication, Wixela, to a medium strength to help better control your symptoms. Please continue taking one puff into your lungs twice a day. A prescription for your new inhaler has been sent to [facility]. We will follow up in six months to see how you are doing.  INSTRUCTIONS: Please follow up in six months for a review of your asthma management and to see how the increased dose of Wixela is working for you.  RX: Wixela 250-50mg  one puffs morning and evening (rinse mouth after use)  Follow-up 6 months with Landry NP

## 2024-03-31 NOTE — Progress Notes (Signed)
 @Patient  ID: Mindy Woodward, adult    DOB: Dec 22, 2000, 23 y.o.   MRN: [id number]  No chief complaint on file.   Referring provider: Sharie Mardy NOVAK, PA-C  HPI: 23 year old, nonbinary.  Past medical history for mild intermittent asthma.  Former patient of Dr. Gladis.  Student at [employer]. Smoked MJ once. No vaping. Has 2 dogs.   Previous [facility] pulmonary encounter:  06/17/2023 Discussed the use of AI scribe software for clinical note transcription with the patient, who gave verbal consent to proceed.  History of Present Illness   The patient presents for a follow-up for asthma management.   The patient initially began to experiencing symptoms such as exertional dyspnea and band-like chest tightness with activities like walking around campus back in 2022. They have been using albuterol  as a rescue inhaler, which provides occasional relief. Since March, they have been consistently using Wixela, one puff twice daily, which has improved their symptoms during exercise but not during periods of acute illnesses.  Patient had Covid-19 in August, treated with paxlovid. In October, the patient experienced a respiratory infection, likely bronchitis, which led to increased use of albuterol , up to four times in one day. They managed this episode with over-the-counter medications without seeking additional medical care. They note that respiratory illnesses are less frequent when not attending college classes, likely due to reduced contact with others.  The patient lives with two dogs and does not smoke. They occasionally experience allergy symptoms such as nasal congestion and runny nose, without a consistent trigger.      1. Mild persistent asthma without complication (Primary)      Mild Asthma Improved with additional of daily Wixela. Patient experiences exacerbation of asthma symptoms with respiratory infections, requiring increased use of rescue inhaler (Albuterol ). No history of  requiring systemic steroids (Prednisone). -Continue Wixela 100-50mcg 1 puff BID daily. -Use Albuterol  as needed for acute symptoms. -Contact clinic if needing Albuterol  more than 2-3 times a day during respiratory infection for potential short course of Prednisone or increased dose of inhaler. -Follow-up in 1 year or sooner if symptoms worsen.    03/31/2024- Interim hx  Discussed the use of AI scribe software for clinical note transcription with the patient, who gave verbal consent to proceed.  History of Present Illness Mindy Woodward is a 23 year old with mild persistent asthma who presents for follow-up of their asthma management.  Asthma symptoms have intermittently impacted daily activities over the past four weeks. Shortness of breath occurs three to six times a week, with no symptoms waking them up at night or in the morning. They use their rescue inhaler, albuterol , two to four times a week, which provides relief for symptoms such as wheezing and shortness of breath. No hospitalizations have occurred in the past six months.  They are currently taking Advair twice a day and have recently opened a new inhaler. They have received flu, COVID, and pneumonia vaccines on October 1st, 2025, at a local pharmacy, which were covered by their insurance.  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  Maintenance regimen- Wixela 100-50mcg one puff q 12 hours  Act score- 18 SABA use - 2-4 times a week  Exacerbations- none   Allergies  Allergen Reactions   Penicillin G Hives   Penicillins     Family member passed from taking PCN    Immunization History  Administered Date(s) Administered   Influenza-Unspecified 04/03/2022   PFIZER(Purple Top)SARS-COV-2  Vaccination 08/25/2019, 09/14/2019, 05/17/2020   Pfizer(Comirnaty)Fall Seasonal Vaccine 12 years and older 02/13/2023   Pneumococcal-Unspecified 04/26/2002, 07/01/2002, 02/22/2003, 08/26/2003   Tdap  02/08/2013, 01/10/2015    Past Medical History:  Diagnosis Date   Asthma    Headache    Photophobia     Tobacco History: Social History   Tobacco Use  Smoking Status Never   Passive exposure: Never  Smokeless Tobacco Never   Counseling given: Not Answered   Outpatient Medications Prior to Visit  Medication Sig Dispense Refill   albuterol  (VENTOLIN  HFA) 108 (90 Base) MCG/ACT inhaler Inhale 2 puffs into the lungs every 4 (four) hours as needed. 18 g 1   amitriptyline  (ELAVIL ) 25 MG tablet Take 1 tablet (25 mg total) by mouth at bedtime. 90 tablet 1   dicyclomine  (BENTYL ) 20 MG tablet Take 1 tablet (20 mg total) by mouth every 12 (twelve) hours as needed (for abdominal pain/cramping). 20 tablet 0   etonogestrel (NEXPLANON) 68 MG IMPL implant 1 each by Subdermal route once.     fluticasone -salmeterol (WIXELA INHUB) 100-50 MCG/ACT AEPB Inhale 1 puff into the lungs 2 (two) times daily. 60 each 2   hydrOXYzine (ATARAX) 25 MG tablet Take 25 mg by mouth daily.     omeprazole  (PRILOSEC) 20 MG capsule Take 1 capsule (20 mg total) by mouth daily. 30 capsule 0   rizatriptan  (MAXALT ) 10 MG tablet Take 1 tablet (10 mg total) by mouth as needed for migraine. May repeat in 2 hours if needed 10 tablet 3   sucralfate  (CARAFATE ) 1 g tablet Take 1 tablet (1 g total) by mouth 4 (four) times daily -  with meals and at bedtime. 120 tablet 0   No facility-administered medications prior to visit.   Review of Systems  Review of Systems  Constitutional: Negative.    Physical Exam  There were no vitals taken for this visit. Physical Exam Constitutional:      Appearance: Normal appearance.  HENT:     Head: Normocephalic and atraumatic.  Cardiovascular:     Rate and Rhythm: Normal rate and regular rhythm.  Pulmonary:     Effort: Pulmonary effort is normal.     Breath sounds: Normal breath sounds.  Musculoskeletal:        General: Normal range of motion.  Skin:    General: Skin is warm and  dry.  Neurological:     General: No focal deficit present.     Mental Status: Neve is alert and oriented to person, place, and time. Mental status is at baseline.  Psychiatric:        Mood and Affect: Mood normal.        Behavior: Behavior normal.        Thought Content: Thought content normal.        Judgment: Judgment normal.     Lab Results:  CBC    Component Value Date/Time   WBC 8.5 04/10/2023 2317   RBC 4.61 04/10/2023 2317   HGB 14.0 04/10/2023 2317   HCT 39.7 04/10/2023 2317   PLT 316 04/10/2023 2317   MCV 86.1 04/10/2023 2317   MCH 30.4 04/10/2023 2317   MCHC 35.3 04/10/2023 2317   RDW 12.9 04/10/2023 2317   LYMPHSABS 2.4 04/10/2023 2317   MONOABS 0.7 04/10/2023 2317   EOSABS 0.1 04/10/2023 2317   BASOSABS 0.1 04/10/2023 2317    BMET    Component Value Date/Time   NA 136 04/10/2023 2317   K 3.5 04/10/2023 2317  CL 106 04/10/2023 2317   CO2 18 (L) 04/10/2023 2317   GLUCOSE 87 04/10/2023 2317   BUN 16 04/10/2023 2317   CREATININE 0.82 04/10/2023 2317   CALCIUM 9.6 04/10/2023 2317   GFRNONAA >60 04/10/2023 2317    BNP No results found for: BNP  ProBNP No results found for: PROBNP  Imaging: No results found.   Assessment & Plan:   1. Mild persistent asthma without complication (Primary)  Assessment and Plan Assessment & Plan Mild persistent asthma, uncomplicated Mild persistent asthma with occasional breakthrough symptoms. Symptoms include shortness of breath, wheezing, and chest tightness occurring three to six times a week. Rescue inhaler used two to four times a week. No recent hospitalizations or exacerbations. ACT score 18. Diagnosis revised from mild intermittent to mild persistent due to frequency of symptoms. Patient is up to date with influenza, covid and pneumonia vaccines.  - Increased dose of Wixela to medium strength - Sent prescription for new inhaler to [facility]. - Scheduled follow-up in six months.   Almarie LELON Ferrari, NP 03/31/2024

## 2024-04-20 ENCOUNTER — Ambulatory Visit: Admitting: Primary Care
